# Patient Record
Sex: Male | Born: 1965 | Race: White | Hispanic: No | Marital: Married | Smoking: Never smoker
Health system: Southern US, Community
[De-identification: ages and names within clinical notes are randomized; demographics above are authoritative.]

## PROBLEM LIST (undated history)

## (undated) DIAGNOSIS — I5022 Chronic systolic (congestive) heart failure: Secondary | ICD-10-CM

## (undated) DIAGNOSIS — E785 Hyperlipidemia, unspecified: Secondary | ICD-10-CM

## (undated) DIAGNOSIS — M109 Gout, unspecified: Secondary | ICD-10-CM

## (undated) DIAGNOSIS — I429 Cardiomyopathy, unspecified: Secondary | ICD-10-CM

## (undated) DIAGNOSIS — I480 Paroxysmal atrial fibrillation: Secondary | ICD-10-CM

## (undated) DIAGNOSIS — I10 Essential (primary) hypertension: Secondary | ICD-10-CM

## (undated) DIAGNOSIS — E119 Type 2 diabetes mellitus without complications: Secondary | ICD-10-CM

## (undated) HISTORY — DX: Gout, unspecified: M10.9

## (undated) HISTORY — DX: Paroxysmal atrial fibrillation: I48.0

## (undated) HISTORY — DX: Hyperlipidemia, unspecified: E78.5

## (undated) HISTORY — DX: Essential (primary) hypertension: I10

## (undated) HISTORY — DX: Chronic systolic (congestive) heart failure: I50.22

## (undated) HISTORY — DX: Type 2 diabetes mellitus without complications: E11.9

## (undated) HISTORY — DX: Cardiomyopathy, unspecified: I42.9

---

## 2006-03-13 ENCOUNTER — Emergency Department: Payer: Self-pay | Admitting: Emergency Medicine

## 2006-10-24 ENCOUNTER — Ambulatory Visit: Payer: Self-pay | Admitting: Gastroenterology

## 2007-11-19 ENCOUNTER — Ambulatory Visit: Payer: Self-pay

## 2011-12-30 ENCOUNTER — Inpatient Hospital Stay: Payer: Self-pay | Admitting: Internal Medicine

## 2011-12-30 LAB — BASIC METABOLIC PANEL
Anion Gap: 9 (ref 7–16)
BUN: 12 mg/dL (ref 7–18)
Calcium, Total: 8.6 mg/dL (ref 8.5–10.1)
Creatinine: 0.73 mg/dL (ref 0.60–1.30)
EGFR (African American): >60
EGFR (Non-African Amer.): >60
Glucose: 254 mg/dL — ABNORMAL HIGH (ref 65–99)
Osmolality: 290 (ref 275–301)
Potassium: 3.7 mmol/L (ref 3.5–5.1)
Sodium: 141 mmol/L (ref 136–145)

## 2011-12-30 LAB — CBC
HCT: 44.1 % (ref 40.0–52.0)
MCHC: 33 g/dL (ref 32.0–36.0)
MCV: 89 fL (ref 80–100)
Platelet: 180 10*3/uL (ref 150–440)
RDW: 13.8 % (ref 11.5–14.5)

## 2011-12-30 LAB — TROPONIN I: Troponin-I: 0.03 ng/mL

## 2011-12-30 LAB — CK TOTAL AND CKMB (NOT AT ARMC): CK, Total: 49 U/L (ref 35–232)

## 2011-12-30 LAB — PRO B NATRIURETIC PEPTIDE: B-Type Natriuretic Peptide: 847 pg/mL — ABNORMAL HIGH (ref 0–125)

## 2011-12-31 DIAGNOSIS — I5021 Acute systolic (congestive) heart failure: Secondary | ICD-10-CM

## 2011-12-31 DIAGNOSIS — I369 Nonrheumatic tricuspid valve disorder, unspecified: Secondary | ICD-10-CM

## 2011-12-31 LAB — COMPREHENSIVE METABOLIC PANEL
Anion Gap: 8 (ref 7–16)
BUN: 11 mg/dL (ref 7–18)
Bilirubin,Total: 0.7 mg/dL (ref 0.2–1.0)
Calcium, Total: 8.6 mg/dL (ref 8.5–10.1)
Chloride: 105 mmol/L (ref 98–107)
Co2: 27 mmol/L (ref 21–32)
Creatinine: 0.72 mg/dL (ref 0.60–1.30)
EGFR (African American): >60
EGFR (Non-African Amer.): >60
Glucose: 203 mg/dL — ABNORMAL HIGH (ref 65–99)
SGOT(AST): 28 U/L (ref 15–37)
SGPT (ALT): 97 U/L — ABNORMAL HIGH (ref 12–78)
Sodium: 140 mmol/L (ref 136–145)

## 2011-12-31 LAB — CBC WITH DIFFERENTIAL/PLATELET
Basophil #: 0.1 10*3/uL (ref 0.0–0.1)
Eosinophil #: 0.1 10*3/uL (ref 0.0–0.7)
HCT: 44.1 % (ref 40.0–52.0)
HGB: 15 g/dL (ref 13.0–18.0)
Lymphocyte %: 28.9 %
MCH: 30.3 pg (ref 26.0–34.0)
MCHC: 34.1 g/dL (ref 32.0–36.0)
MCV: 89 fL (ref 80–100)
Monocyte #: 0.7 x10 3/mm (ref 0.2–1.0)
Neutrophil #: 3.7 10*3/uL (ref 1.4–6.5)
Neutrophil %: 57.7 %
RDW: 13.8 % (ref 11.5–14.5)

## 2011-12-31 LAB — TSH: Thyroid Stimulating Horm: 1.15 u[IU]/mL

## 2011-12-31 LAB — HEMOGLOBIN A1C: Hemoglobin A1C: 10 % — ABNORMAL HIGH (ref 4.2–6.3)

## 2011-12-31 LAB — MAGNESIUM: Magnesium: 1.7 mg/dL — ABNORMAL LOW

## 2011-12-31 LAB — LIPID PANEL: Triglycerides: 254 mg/dL — ABNORMAL HIGH (ref 0–200)

## 2012-01-01 ENCOUNTER — Encounter: Payer: Self-pay | Admitting: *Deleted

## 2012-01-01 ENCOUNTER — Telehealth: Payer: Self-pay | Admitting: *Deleted

## 2012-01-01 MED ORDER — ASPIRIN 81 MG PO TABS: 81.0000 mg | ORAL_TABLET | Freq: Every day | ORAL | Status: AC

## 2012-01-01 MED ORDER — CARVEDILOL 6.25 MG PO TABS: 6.2500 mg | ORAL_TABLET | Freq: Two times a day (BID) | ORAL | Status: DC

## 2012-01-01 MED ORDER — LISINOPRIL 20 MG PO TABS: 20.0000 mg | ORAL_TABLET | Freq: Every day | ORAL | Status: DC

## 2012-01-01 MED ORDER — ATORVASTATIN CALCIUM 40 MG PO TABS: 40.0000 mg | ORAL_TABLET | Freq: Every day | ORAL | Status: DC

## 2012-01-01 MED ORDER — FUROSEMIDE 20 MG PO TABS: 20.0000 mg | ORAL_TABLET | Freq: Two times a day (BID) | ORAL | Status: DC

## 2012-01-01 MED ORDER — METFORMIN HCL 500 MG PO TABS: 500.0000 mg | ORAL_TABLET | Freq: Two times a day (BID) | ORAL | Status: DC

## 2012-01-01 NOTE — Telephone Encounter (Signed)
Hospital Discharge transitional care: pt denies swelling but has had occasional SOB last night- no SOB currently. Discussed EF > 25% and importance of daily weights and low sodium diet/ high sodium foods reviewed to avoid , reviewed all med's and pt has all available to him. Confirmed app date time and location. Asked him to call with questions and concerns, pt agreed to plan.

## 2012-01-07 ENCOUNTER — Ambulatory Visit (INDEPENDENT_AMBULATORY_CARE_PROVIDER_SITE_OTHER): Payer: BC Managed Care – PPO | Admitting: Nurse Practitioner

## 2012-01-07 ENCOUNTER — Encounter: Payer: Self-pay | Admitting: Nurse Practitioner

## 2012-01-07 VITALS — BP 90/62 | HR 83 | Ht 72.0 in | Wt 257.5 lb

## 2012-01-07 DIAGNOSIS — I480 Paroxysmal atrial fibrillation: Secondary | ICD-10-CM

## 2012-01-07 DIAGNOSIS — I428 Other cardiomyopathies: Secondary | ICD-10-CM

## 2012-01-07 DIAGNOSIS — I5022 Chronic systolic (congestive) heart failure: Secondary | ICD-10-CM

## 2012-01-07 DIAGNOSIS — I10 Essential (primary) hypertension: Secondary | ICD-10-CM

## 2012-01-07 DIAGNOSIS — I4891 Unspecified atrial fibrillation: Secondary | ICD-10-CM

## 2012-01-07 DIAGNOSIS — I420 Dilated cardiomyopathy: Secondary | ICD-10-CM | POA: Insufficient documentation

## 2012-01-07 DIAGNOSIS — I509 Heart failure, unspecified: Secondary | ICD-10-CM

## 2012-01-07 DIAGNOSIS — R0602 Shortness of breath: Secondary | ICD-10-CM

## 2012-01-07 DIAGNOSIS — E119 Type 2 diabetes mellitus without complications: Secondary | ICD-10-CM

## 2012-01-07 MED ORDER — LISINOPRIL 10 MG PO TABS: 10.0000 mg | ORAL_TABLET | Freq: Every day | ORAL | Status: DC

## 2012-01-07 NOTE — Patient Instructions (Addendum)
Your physician wants you to follow-up 02/04/12 at 10:30 am with Dr. Mariah Milling. You will receive a reminder letter in the mail two months in advance. If you don't receive a letter, please call our office to schedule the follow-up appointment.  Your physician has recommended you make the following change in your medication:  -decrease lisinopril to 10 mg daily  [facility] MYOVIEW  Your caregiver has ordered a Stress Test with nuclear imaging. The purpose of this test is to evaluate the blood supply to your heart muscle. This procedure is referred to as a "Non-Invasive Stress Test." This is because other than having an IV started in your vein, nothing is inserted or "invades" your body. Cardiac stress tests are done to find areas of poor blood flow to the heart by determining the extent of coronary artery disease (CAD). Some patients exercise on a treadmill, which naturally increases the blood flow to your heart, while others who are  unable to walk on a treadmill due to physical limitations have a pharmacologic/chemical stress agent called Lexiscan . This medicine will mimic walking on a treadmill by temporarily increasing your coronary blood flow.   Please note: these test may take anywhere between 2-4 hours to complete  PLEASE REPORT TO [facility] MEDICAL MALL ENTRANCE  THE VOLUNTEERS AT THE FIRST DESK WILL DIRECT YOU WHERE TO GO  Date of Procedure:_____________________________________  Arrival Time for Procedure:______________________________  Instructions regarding medication:   ____ : Hold diabetes medication morning of procedure  ____:  Hold betablocker(s) night before procedure and morning of procedure  ____:  Hold other medications as  follows:_________________________________________________________________________________________________________________________________________________________________________________________________________________________________________________________________________________________  PLEASE NOTIFY THE OFFICE AT LEAST 24 HOURS IN ADVANCE IF YOU ARE UNABLE TO KEEP YOUR APPOINTMENT.  [phone]  How to prepare for your Myoview test:  1. Do not eat or drink after midnight 2. No caffeine for 24 hours prior to test 3. Your medication may be taken with water.  If your doctor stopped a medication because of this test, do not take that medication. 4. Ladies, please do not wear dresses.  Skirts or pants are appropriate. Please wear a short sleeve shirt. 5. No perfume, cologne or lotion. 6. Wear comfortable walking shoes. No heels!  Your physician has recommended that you wear an event monitor. Event monitors are medical devices that record the heart's electrical activity. Doctors most often [place] these monitors to diagnose arrhythmias. Arrhythmias are problems with the speed or rhythm of the heartbeat. The monitor is a small, portable device. You can wear one while you do your normal daily activities. This is usually used to diagnose what is causing palpitations/syncope (passing out).

## 2012-01-07 NOTE — Progress Notes (Signed)
Patient Name: Brad Stephens Date of Encounter: 01/07/2012  Primary Cardiologist:  Concha Se, MD  Patient Profile  46 year old male recently diagnosed with cardiomyopathy who presents for hospital follow-up.  Problem List   Past Medical History  Diagnosis Date  . Hypertension   . Gout   . Cardiomyopathy     a. 12/2011 Echo: EF <25%, glob HK, mildly dilated LA,.  . Paroxysmal atrial fibrillation     a. 12/2011 2 hr run while hospitalized @ [facility] for CHF;  b. CHADS2 = 3 (CHF, HTN, DM).  . Diabetes     a. 12/2011 A1c = 10.0.  Marland Kitchen Hyperlipidemia    History reviewed. No pertinent past surgical history.  Allergies  Allergies  Allergen Reactions  . Codeine   . Prednisone   . Shellfish Allergy     HPI  46 year old male with the above problem list.  He was in his usual state of health until sometime just prior to Thanksgiving this year when he began to experience nasal and chest congestion along with cough.  He used over-the-counter medications for treatment of presumed bronchitis.  Unfortunately, symptoms persisted and subsequently worsened to include dyspnea with minimal activity followed by orthopnea.  He was admitted to [facility] regional on December 22 secondary to worsening orthopnea.  There, his chest x-ray showed pulmonary edema.  He was admitted for further evaluation.  An echocardiogram was carried out and showed an EF of less than 25%.  He was seen by cardiology and placed on beta blocker, ACE inhibitor, and diuretic therapy.  With diuresis, he had symptomatic improvement.  He was noted to have a two-hour episode of paroxysmal atrial fibrillation.  He was placed on aspirin.  He was also found to have an elevated hemoglobin A1c of 10.0.  He was placed on metformin therapy and advised to followup with his primary care provider.  Since discharge from the hospital, he has overall been feeling well.  His weight has been coming down and he is 257 today which he says is 5 pounds less  than what he wasn't discharged.  He has had marked improvement in dyspnea although he did have a brief episode of orthopnea 2 nights ago.  He has occasionally noted some dizziness that occurs when he goes from a lying or sitting to standing position.  He does not check his blood pressure at home.  He denies chest pain, pnd, n, v, dizziness, syncope, edema, weight gain, or early satiety.  Home Medications  Prior to Admission medications   Medication Sig Start Date End Date Taking? Authorizing Provider  aspirin 81 MG tablet Take 1 tablet (81 mg total) by mouth daily. 01/01/12  Yes Antonieta Iba, MD  atorvastatin (LIPITOR) 40 MG tablet Take 1 tablet (40 mg total) by mouth daily. 01/01/12  Yes Antonieta Iba, MD  carvedilol (COREG) 6.25 MG tablet Take 1 tablet (6.25 mg total) by mouth 2 (two) times daily. 01/01/12  Yes Antonieta Iba, MD  furosemide (LASIX) 20 MG tablet Take 1 tablet (20 mg total) by mouth 2 (two) times daily. 01/01/12  Yes Antonieta Iba, MD  lisinopril (PRINIVIL,ZESTRIL) 20 MG tablet Take 1 tablet (20 mg total) by mouth daily. **reduced to 1/2 tab daily today. 01/01/12  Yes Antonieta Iba, MD  metFORMIN (GLUCOPHAGE) 500 MG tablet Take 1 tablet (500 mg total) by mouth 2 (two) times daily with a meal. 01/01/12  Yes Antonieta Iba, MD   Review of Systems  Occasional orthostatic  dizziness as noted above.  1 episode of orthopnea 2 nights ago requiring that he sleep in his recliner, though weight has been coming down.  All other systems reviewed and are otherwise negative except as noted above.  Physical Exam  Blood pressure 90/62, pulse 83, height 6' (1.829 m), weight 257 lb 8 oz (116.801 kg).  General: Pleasant, NAD Psych: Normal affect. Neuro: Alert and oriented X 3. Moves all extremities spontaneously. HEENT: Normal  Neck: Supple without bruits or JVD. Lungs:  Resp regular and unlabored, CTA. Heart: RRR no s3, s4, or murmurs. Abdomen: Soft, non-tender,  non-distended, BS + x 4.  Extremities: No clubbing, cyanosis or edema. DP/PT/Radials 2+ and equal bilaterally.  Accessory Clinical Findings  ECG - rsr, 83, no acute st/t changes.  Assessment & Plan  1.  Cardiomyopathy/Acute systolic CHF:  ? Ischemic vs non-ischemic.  Ss started with viral type illness/bronchitis back in November.  Today, he is euvolemic.  He has mostly been feeling well w/ significant reduction in DOE.  He is weighing himself daily and trying to avoid salty foods.  He did have 1 episode of orthopnea 2 nights ago, but this has not recurred.  I have arranged for an exercise myoview to r/o ischemia as a contributor to his cardiomyopathy.  Given complaints of orthostasis/dizziness, with a BP of 90/62, I have reduced his lisinopril to 10mg  daily.  I will check a BMET today to f/u renal fxn/potassium (he is not on replacement).  F/U echo in 12 wks.  2.  Orthostasis/Relative Hypotn:  Reduce lisinopril to 10mg  daily as above.  3.  PAF:  Brief spell of afib while hospitalized.  He says that he felt it while in it.  He has had no recurrence of palpitations since d/c but says that he thinks he's had afib before.  I am placing an event monitor to assess his burden of afib.  Given his CHADS2 score of 3 (CHF, HTN, DM), would plan to place him on oral anticoagulation if he has more afib.  Cont bb and asa for the time being.  4.  HTN:  Stable to low.  Reducing lisinopril as above.  5.  HL:  He is on lipitor.  TC 149, LDL 65.  TG were elevated @ 254.  ALT was mildly elevated in hospital in setting of acute chf.  He will need f/u in 8 wks.  6.  DM:  A1c = 10 in hospital.  On metformin.  We discussed role of weight loss in mgmt.  He will f/u with his pcp.  7.  Dispo:  Bmet today.  Myoview and event monitor to be scheduled.  Follow up with Dr. Mariah Milling in approximately 4 wks.  Nicolasa Ducking, NP 01/07/2012, 12:28 PM

## 2012-01-08 DIAGNOSIS — R0602 Shortness of breath: Secondary | ICD-10-CM

## 2012-01-08 LAB — BASIC METABOLIC PANEL
BUN: 13 mg/dL (ref 6–24)
CO2: 23 mmol/L (ref 19–28)
Calcium: 9.3 mg/dL (ref 8.7–10.2)
Chloride: 101 mmol/L (ref 97–108)
Creatinine, Ser: 0.93 mg/dL (ref 0.76–1.27)
Glucose: 164 mg/dL — ABNORMAL HIGH (ref 65–99)

## 2012-01-08 NOTE — Progress Notes (Unsigned)
ecardio monitor ordered 

## 2012-01-11 DIAGNOSIS — I4891 Unspecified atrial fibrillation: Secondary | ICD-10-CM

## 2012-01-16 ENCOUNTER — Ambulatory Visit: Payer: Self-pay | Admitting: Cardiovascular Disease

## 2012-01-16 DIAGNOSIS — R0602 Shortness of breath: Secondary | ICD-10-CM

## 2012-01-17 ENCOUNTER — Telehealth: Payer: Self-pay

## 2012-01-17 NOTE — Telephone Encounter (Signed)
"  call pt to tell him stress test was normal: no ischemia but EF is still low -30%. Tell him to stop ETOH and keep taking meds as prescribed. Keep follow up with me as scheduled" VO Dr. Alvis Lemmings, RN

## 2012-01-17 NOTE — Telephone Encounter (Signed)
Pt informed Understanding verb 

## 2012-01-24 ENCOUNTER — Other Ambulatory Visit: Payer: Self-pay | Admitting: *Deleted

## 2012-01-24 DIAGNOSIS — R0602 Shortness of breath: Secondary | ICD-10-CM

## 2012-01-28 ENCOUNTER — Ambulatory Visit: Payer: BC Managed Care – PPO | Admitting: Cardiovascular Disease

## 2012-01-31 ENCOUNTER — Ambulatory Visit (INDEPENDENT_AMBULATORY_CARE_PROVIDER_SITE_OTHER): Payer: BC Managed Care – PPO | Admitting: Cardiovascular Disease

## 2012-01-31 ENCOUNTER — Encounter: Payer: Self-pay | Admitting: Cardiovascular Disease

## 2012-01-31 VITALS — BP 138/74 | HR 64 | Ht 72.0 in | Wt 252.5 lb

## 2012-01-31 DIAGNOSIS — I4891 Unspecified atrial fibrillation: Secondary | ICD-10-CM

## 2012-01-31 DIAGNOSIS — I10 Essential (primary) hypertension: Secondary | ICD-10-CM

## 2012-01-31 DIAGNOSIS — I509 Heart failure, unspecified: Secondary | ICD-10-CM

## 2012-01-31 DIAGNOSIS — I5022 Chronic systolic (congestive) heart failure: Secondary | ICD-10-CM

## 2012-01-31 DIAGNOSIS — E119 Type 2 diabetes mellitus without complications: Secondary | ICD-10-CM

## 2012-01-31 DIAGNOSIS — I428 Other cardiomyopathies: Secondary | ICD-10-CM

## 2012-01-31 DIAGNOSIS — I480 Paroxysmal atrial fibrillation: Secondary | ICD-10-CM

## 2012-01-31 DIAGNOSIS — I420 Dilated cardiomyopathy: Secondary | ICD-10-CM

## 2012-01-31 MED ORDER — FUROSEMIDE 20 MG PO TABS: 20.0000 mg | ORAL_TABLET | Freq: Two times a day (BID) | ORAL | Status: DC

## 2012-01-31 MED ORDER — CARVEDILOL 6.25 MG PO TABS: 6.2500 mg | ORAL_TABLET | Freq: Two times a day (BID) | ORAL | Status: DC

## 2012-01-31 MED ORDER — LISINOPRIL 10 MG PO TABS: 10.0000 mg | ORAL_TABLET | Freq: Every day | ORAL | Status: DC

## 2012-01-31 NOTE — Progress Notes (Signed)
Patient ID: Brad Stephens, male    DOB: 09/06/1965, 47 y.o.   MRN: [id number]  HPI Comments: 47 year old male with nonischemic cardiomyopathy presenting in December 2013 with shortness of breath, orthopnea, PND . Outpatient stress test showed no ischemia . 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 He travels frequently for business trips. He had short run of atrial fibrillation in the hospital.  He was started on Coreg, lisinopril, diuretics. Secondary to very low systolic pressure, lisinopril dose was decreased from 20 mg to 10 mg . Lasix dose was decreased. He takes this now occasionally . No edema, abdominal swelling or shortness of breath with exertion . He's been trying to eat health, less fluid intake, alcohol abstinence .    echocardiogram was carried out and showed an EF of less than 25%.    hemoglobin A1c of 10.0.    Overall he feels well today with no complaints. It was suggested he wear a 30 day monitor. Evaluation of this shows no significant ischemia. No atrial fibrillation   Outpatient Encounter Prescriptions as of 01/31/2012  Medication Sig Dispense Refill  . aspirin 81 MG tablet Take 1 tablet (81 mg total) by mouth daily.  30 tablet    . atorvastatin (LIPITOR) 40 MG tablet Take 1 tablet (40 mg total) by mouth daily.  90 tablet  3  . carvedilol (COREG) 6.25 MG tablet Take 1 tablet (6.25 mg total) by mouth 2 (two) times daily.  180 tablet  3  . lisinopril (PRINIVIL,ZESTRIL) 10 MG tablet Take 1 tablet (10 mg total) by mouth daily.  90 tablet  3  . metFORMIN (GLUCOPHAGE) 500 MG tablet Take 1 tablet (500 mg total) by mouth 2 (two) times daily with a meal.        Review of Systems  Constitutional: Negative.   HENT: Negative.   Eyes: Negative.   Respiratory: Negative.   Cardiovascular: Negative.   Gastrointestinal: Negative.   Musculoskeletal: Negative.   Skin: Negative.   Neurological: Negative.   Hematological: Negative.   Psychiatric/Behavioral: Negative.   All  other systems reviewed and are negative.    BP 138/74  Pulse 64  Ht 6' (1.829 m)  Wt 252 lb 8 oz (114.533 kg)  BMI 34.25 kg/m2  Physical Exam  Nursing note and vitals reviewed. Constitutional: He is oriented to person, place, and time. He appears well-developed and well-nourished.  HENT:  Head: Normocephalic.  Nose: Nose normal.  Mouth/Throat: Oropharynx is clear and moist.  Eyes: Conjunctivae normal are normal. Pupils are equal, round, and reactive to light.  Neck: Normal range of motion. Neck supple. No JVD present.  Cardiovascular: Normal rate, regular rhythm, S1 normal, S2 normal, normal heart sounds and intact distal pulses.  Exam reveals no gallop and no friction rub.   No murmur heard. Pulmonary/Chest: Effort normal and breath sounds normal. No respiratory distress. He has no wheezes. He has no rales. He exhibits no tenderness.  Abdominal: Soft. Bowel sounds are normal. He exhibits no distension. There is no tenderness.  Musculoskeletal: Normal range of motion. He exhibits no edema and no tenderness.  Lymphadenopathy:    He has no cervical adenopathy.  Neurological: He is alert and oriented to person, place, and time. Coordination normal.  Skin: Skin is warm and dry. No rash noted. No erythema.  Psychiatric: He has a normal mood and affect. His behavior is normal. Judgment and thought content normal.           Assessment and Plan

## 2012-01-31 NOTE — Assessment & Plan Note (Signed)
We have recommended alcohol abstinence, low fluid intake, continue to take his medications, Lasix as needed for edema or any weight gain.

## 2012-01-31 NOTE — Assessment & Plan Note (Signed)
Blood pressure is well controlled on today's visit. No changes made to the medications. 

## 2012-01-31 NOTE — Assessment & Plan Note (Signed)
No arrhythmia seen on 30 day monitor. No symptoms of palpitations or tachycardia.

## 2012-01-31 NOTE — Assessment & Plan Note (Signed)
He'll need followup with his primary care physician for his diabetes. He does not have the monitoring equipment for home checking.

## 2012-01-31 NOTE — Patient Instructions (Addendum)
You are doing well. No medication changes were made.  Please take lasix as needed for edema, shortness of breath or weight gain  Please call us if you have new issues that need to be addressed before your next appt.  Your physician wants you to follow-up in: 6 months.  You will receive a reminder letter in the mail two months in advance. If you don't receive a letter, please call our office to schedule the follow-up appointment.

## 2012-01-31 NOTE — Assessment & Plan Note (Signed)
Appears euvolemic on clinical exam. We have suggested he watch his weight closely, use Lasix for any weight gain. Continue on lisinopril and carvedilol. He does not want to increase the lisinopril as this caused low blood pressure in the past.

## 2012-02-04 ENCOUNTER — Ambulatory Visit: Payer: BC Managed Care – PPO | Admitting: Cardiovascular Disease

## 2012-02-23 ENCOUNTER — Other Ambulatory Visit: Payer: Self-pay

## 2012-09-03 ENCOUNTER — Telehealth: Payer: Self-pay | Admitting: *Deleted

## 2012-09-03 NOTE — Telephone Encounter (Signed)
Lmom to call to sched and appt w/ Dr. Mariah Milling.

## 2012-11-13 ENCOUNTER — Other Ambulatory Visit: Payer: Self-pay

## 2013-03-23 ENCOUNTER — Other Ambulatory Visit: Payer: Self-pay | Admitting: Cardiovascular Disease

## 2013-12-21 LAB — CBC
HCT: 45.6 % (ref 40.0–52.0)
HGB: 15.3 g/dL (ref 13.0–18.0)
MCH: 30.5 pg (ref 26.0–34.0)
MCHC: 33.5 g/dL (ref 32.0–36.0)
MCV: 91 fL (ref 80–100)
Platelet: 181 10*3/uL (ref 150–440)
RBC: 5.01 10*6/uL (ref 4.40–5.90)
RDW: 13.4 % (ref 11.5–14.5)
WBC: 9.7 10*3/uL (ref 3.8–10.6)

## 2013-12-21 LAB — BASIC METABOLIC PANEL
Anion Gap: 10 (ref 7–16)
BUN: 13 mg/dL (ref 7–18)
CALCIUM: 8.7 mg/dL (ref 8.5–10.1)
CHLORIDE: 100 mmol/L (ref 98–107)
Co2: 27 mmol/L (ref 21–32)
Creatinine: 0.84 mg/dL (ref 0.60–1.30)
EGFR (African American): >60
EGFR (Non-African Amer.): >60
Glucose: 359 mg/dL — ABNORMAL HIGH (ref 65–99)
Osmolality: 288 (ref 275–301)
POTASSIUM: 3.8 mmol/L (ref 3.5–5.1)
SODIUM: 137 mmol/L (ref 136–145)

## 2013-12-21 LAB — TROPONIN I: TROPONIN-I: 0.02 ng/mL

## 2013-12-21 LAB — PROTIME-INR
INR: 1
PROTHROMBIN TIME: 13.4 s (ref 11.5–14.7)

## 2013-12-21 LAB — PRO B NATRIURETIC PEPTIDE: B-Type Natriuretic Peptide: 1391 pg/mL — ABNORMAL HIGH (ref 0–125)

## 2013-12-22 ENCOUNTER — Inpatient Hospital Stay: Payer: Self-pay | Admitting: Internal Medicine

## 2013-12-22 LAB — CK-MB: CK-MB: 0.6 ng/mL (ref 0.5–3.6)

## 2013-12-22 LAB — HEMOGLOBIN A1C: Hemoglobin A1C: 10.7 % — ABNORMAL HIGH (ref 4.2–6.3)

## 2013-12-22 LAB — TROPONIN I
TROPONIN-I: 0.02 ng/mL
Troponin-I: 0.02 ng/mL
Troponin-I: <0.02 ng/mL

## 2013-12-22 LAB — CK TOTAL AND CKMB (NOT AT ARMC)
CK, Total: 57 U/L (ref 39–308)
CK-MB: <0.5 ng/mL — ABNORMAL LOW (ref 0.5–3.6)

## 2013-12-23 LAB — CBC WITH DIFFERENTIAL/PLATELET
BASOS PCT: 0.5 %
Basophil #: 0 10*3/uL (ref 0.0–0.1)
Eosinophil #: 0 10*3/uL (ref 0.0–0.7)
Eosinophil %: 0.3 %
HCT: 41.4 % (ref 40.0–52.0)
HGB: 13.5 g/dL (ref 13.0–18.0)
LYMPHS ABS: 1.7 10*3/uL (ref 1.0–3.6)
Lymphocyte %: 19.1 %
MCH: 30.3 pg (ref 26.0–34.0)
MCHC: 32.6 g/dL (ref 32.0–36.0)
MCV: 93 fL (ref 80–100)
MONO ABS: 1 x10 3/mm (ref 0.2–1.0)
Monocyte %: 11 %
NEUTROS PCT: 69.1 %
Neutrophil #: 6.3 10*3/uL (ref 1.4–6.5)
Platelet: 156 10*3/uL (ref 150–440)
RBC: 4.45 10*6/uL (ref 4.40–5.90)
RDW: 13.6 % (ref 11.5–14.5)
WBC: 9.1 10*3/uL (ref 3.8–10.6)

## 2013-12-23 LAB — LIPID PANEL
Cholesterol: 111 mg/dL (ref 0–200)
Cholesterol: 111 mg/dL (ref 0–200)
HDL Cholesterol: 55 mg/dL (ref 40–60)
HDL: 55 mg/dL (ref 35–70)
LDL CALC: 31 mg/dL
Ldl Cholesterol, Calc: 31 mg/dL (ref 0–100)
Triglycerides: 124 mg/dL (ref 0–200)
Triglycerides: 124 mg/dL (ref 40–160)
VLDL Cholesterol, Calc: 25 mg/dL (ref 5–40)

## 2013-12-23 LAB — BASIC METABOLIC PANEL
Anion Gap: 9 (ref 7–16)
BUN: 15 mg/dL (ref 4–21)
BUN: 15 mg/dL (ref 7–18)
CALCIUM: 8.4 mg/dL — AB (ref 8.5–10.1)
Chloride: 99 mmol/L (ref 98–107)
Co2: 27 mmol/L (ref 21–32)
Creatinine: 0.8 mg/dL (ref 0.6–1.3)
Creatinine: 0.84 mg/dL (ref 0.60–1.30)
EGFR (African American): >60
EGFR (Non-African Amer.): >60
Glucose: 215 mg/dL
Glucose: 215 mg/dL — ABNORMAL HIGH (ref 65–99)
Osmolality: 277 (ref 275–301)
POTASSIUM: 4 mmol/L (ref 3.5–5.1)
Potassium: 4 mmol/L (ref 3.4–5.3)
Sodium: 135 mmol/L — AB (ref 137–147)
Sodium: 135 mmol/L — ABNORMAL LOW (ref 136–145)

## 2013-12-23 LAB — TSH
THYROID STIMULATING HORM: 0.84 u[IU]/mL
TSH: 0.84 u[IU]/mL (ref 0.41–5.90)

## 2013-12-23 LAB — HEMOGLOBIN A1C: Hgb A1c MFr Bld: 10.7 % — AB (ref 4.0–6.0)

## 2013-12-23 LAB — MAGNESIUM: MAGNESIUM: 1.7 mg/dL — AB

## 2013-12-24 LAB — BASIC METABOLIC PANEL
ANION GAP: 2 — AB (ref 7–16)
BUN: 20 mg/dL — ABNORMAL HIGH (ref 7–18)
CHLORIDE: 104 mmol/L (ref 98–107)
Calcium, Total: 8.6 mg/dL (ref 8.5–10.1)
Co2: 25 mmol/L (ref 21–32)
Creatinine: 0.8 mg/dL (ref 0.60–1.30)
EGFR (Non-African Amer.): >60
Glucose: 240 mg/dL — ABNORMAL HIGH (ref 65–99)
Osmolality: 273 (ref 275–301)
Potassium: 4.2 mmol/L (ref 3.5–5.1)
Sodium: 131 mmol/L — ABNORMAL LOW (ref 136–145)

## 2013-12-24 LAB — MAGNESIUM: MAGNESIUM: 2 mg/dL

## 2013-12-28 ENCOUNTER — Ambulatory Visit: Payer: BC Managed Care – PPO | Admitting: Cardiovascular Disease

## 2014-01-02 ENCOUNTER — Other Ambulatory Visit: Payer: Self-pay | Admitting: Cardiovascular Disease

## 2014-01-26 ENCOUNTER — Ambulatory Visit: Payer: Self-pay | Admitting: Cardiovascular Disease

## 2014-01-28 ENCOUNTER — Other Ambulatory Visit: Payer: Self-pay | Admitting: Cardiovascular Disease

## 2014-02-05 ENCOUNTER — Encounter: Payer: Self-pay | Admitting: Cardiovascular Disease

## 2014-02-05 ENCOUNTER — Ambulatory Visit (INDEPENDENT_AMBULATORY_CARE_PROVIDER_SITE_OTHER): Payer: BLUE CROSS/BLUE SHIELD | Admitting: Cardiovascular Disease

## 2014-02-05 VITALS — BP 110/68 | HR 99 | Ht 72.0 in | Wt 252.5 lb

## 2014-02-05 DIAGNOSIS — R002 Palpitations: Secondary | ICD-10-CM

## 2014-02-05 DIAGNOSIS — E119 Type 2 diabetes mellitus without complications: Secondary | ICD-10-CM

## 2014-02-05 DIAGNOSIS — I420 Dilated cardiomyopathy: Secondary | ICD-10-CM

## 2014-02-05 DIAGNOSIS — I480 Paroxysmal atrial fibrillation: Secondary | ICD-10-CM

## 2014-02-05 DIAGNOSIS — I5022 Chronic systolic (congestive) heart failure: Secondary | ICD-10-CM

## 2014-02-05 DIAGNOSIS — E1169 Type 2 diabetes mellitus with other specified complication: Secondary | ICD-10-CM

## 2014-02-05 DIAGNOSIS — F1010 Alcohol abuse, uncomplicated: Secondary | ICD-10-CM | POA: Insufficient documentation

## 2014-02-05 MED ORDER — LISINOPRIL 5 MG PO TABS: 5.0000 mg | ORAL_TABLET | Freq: Every day | ORAL | Status: DC

## 2014-02-05 MED ORDER — DILTIAZEM HCL 30 MG PO TABS: 30.0000 mg | ORAL_TABLET | Freq: Four times a day (QID) | ORAL | Status: DC | PRN

## 2014-02-05 MED ORDER — FUROSEMIDE 20 MG PO TABS: 20.0000 mg | ORAL_TABLET | Freq: Two times a day (BID) | ORAL | Status: DC | PRN

## 2014-02-05 MED ORDER — CARVEDILOL 25 MG PO TABS: 25.0000 mg | ORAL_TABLET | Freq: Two times a day (BID) | ORAL | Status: DC

## 2014-02-05 MED ORDER — METFORMIN HCL 1000 MG PO TABS: 1000.0000 mg | ORAL_TABLET | Freq: Two times a day (BID) | ORAL | Status: DC

## 2014-02-05 MED ORDER — ATORVASTATIN CALCIUM 40 MG PO TABS: 40.0000 mg | ORAL_TABLET | Freq: Every day | ORAL | Status: DC

## 2014-02-05 NOTE — Progress Notes (Signed)
Patient ID: Brad Stephens, male    DOB: 11-17-1965, 49 y.o.   MRN: [id number]  HPI Comments: 49 year old male with nonischemic cardiomyopathy possiblysecondary to alcohol abuse, who continues to drink up to one half Casebeer at a time,  presenting in December 2013 with shortness of breath, orthopnea, PND . Outpatient stress test showed no ischemia . Marland Kitchen He travels frequently for business trips. He had short run of atrial fibrillation in the hospital. He presents today after recent hospital admission 12/22/2013 He was last seen in 2014  He presented to the hospital with midsternal chest pain relief with narcotics, glucose was 350, in the emergency room was found to be in atrial flutter, converting to normal sinus rhythm with medical management. Echocardiogram confirmed ejection fraction 20-25% which was previously seen in 2013, hemoglobin A1c 10.6 It was felt he had pleuritic chest pain, possibly exacerbated by arrhythmia On today's visit he reports that he is doing okay. He is taking his discharge medications including Lipitor, carvedilol, Lasix, lisinopril, metformin 1000 mg twice a day  Lab work in the hospital showing total cholesterol 111, LDL 31, TSH 0.84, negative cardiac enzymes CT angiogram of the chest showing no PE, mild bilateral pleural effusions EKG on today's visit showing normal sinus rhythm with rate 99 bpm, T-wave abnormality-  He has started to change his diet, drinking shakes.  Allergies  Allergen Reactions  . Codeine   . Prednisone   . Shellfish Allergy     Outpatient Encounter Prescriptions as of 02/05/2014  Medication Sig  . aspirin 81 MG tablet Take 1 tablet (81 mg total) by mouth daily.  Marland Kitchen atorvastatin (LIPITOR) 40 MG tablet Take 1 tablet (40 mg total) by mouth daily.  . carvedilol (COREG) 25 MG tablet Take 1 tablet (25 mg total) by mouth 2 (two) times daily.  . furosemide (LASIX) 20 MG tablet Take 1 tablet (20 mg total) by mouth 2 (two) times daily as needed.  Marland Kitchen  lisinopril (PRINIVIL,ZESTRIL) 5 MG tablet Take 1 tablet (5 mg total) by mouth daily.  . metFORMIN (GLUCOPHAGE) 1000 MG tablet Take 1 tablet (1,000 mg total) by mouth 2 (two) times daily with a meal.  . [DISCONTINUED] atorvastatin (LIPITOR) 40 MG tablet Take 1 tablet (40 mg total) by mouth daily.  . [DISCONTINUED] carvedilol (COREG) 12.5 MG tablet Take 12.5 mg by mouth 2 (two) times daily.  . [DISCONTINUED] furosemide (LASIX) 20 MG tablet Take 20 mg by mouth daily.  . [DISCONTINUED] lisinopril (PRINIVIL,ZESTRIL) 5 MG tablet Take 5 mg by mouth daily.  . [DISCONTINUED] metFORMIN (GLUCOPHAGE) 1000 MG tablet Take 1,000 mg by mouth 2 (two) times daily with a meal.  . diltiazem (CARDIZEM) 30 MG tablet Take 1 tablet (30 mg total) by mouth 4 (four) times daily as needed.  . [DISCONTINUED] carvedilol (COREG) 6.25 MG tablet Take 1 tablet (6.25 mg total) by mouth 2 (two) times daily. (Patient not taking: Reported on 02/05/2014)  . [DISCONTINUED] furosemide (LASIX) 20 MG tablet Take 1 tablet (20 mg total) by mouth 2 (two) times daily. (Patient not taking: Reported on 02/05/2014)  . [DISCONTINUED] lisinopril (PRINIVIL,ZESTRIL) 10 MG tablet TAKE 1 TABLET (10 MG TOTAL) BY MOUTH DAILY. (Patient not taking: Reported on 02/05/2014)  . [DISCONTINUED] metFORMIN (GLUCOPHAGE) 500 MG tablet Take 1 tablet (500 mg total) by mouth 2 (two) times daily with a meal. (Patient not taking: Reported on 02/05/2014)    Past Medical History  Diagnosis Date  . Hypertension   . Gout   . Cardiomyopathy  a. 12/2011 Echo: EF <25%, glob HK, mildly dilated LA,.  . Paroxysmal atrial fibrillation     a. 12/2011 2 hr run [facility]F;  b. CHADS2 = 3 (CHF, HTN, DM).  . Diabetes     a. 12/2011 A1c = 10.0.  Marland Kitchen. Hyperlipidemia   . Chronic systolic CHF (congestive heart failure)     History reviewed. No pertinent past surgical history.  Social History  reports that he has never smoked. His smokeless tobacco use  includes Chew. He reports that he drinks alcohol. He reports that he does not use illicit drugs.  Family History Family history is unknown by patient.  Review of Systems  Constitutional: Negative.   HENT: Negative.   Respiratory: Negative.   Cardiovascular: Positive for palpitations.  Gastrointestinal: Negative.   Musculoskeletal: Negative.   Skin: Negative.   Neurological: Negative.   Hematological: Negative.   Psychiatric/Behavioral: Negative.   All other systems reviewed and are negative.   BP 110/68 mmHg  Pulse 99  Ht 6' (1.829 m)  Wt 252 lb 8 oz (114.533 kg)  BMI 34.24 kg/m2   Physical Exam  Constitutional: He is oriented to person, place, and time. He appears well-developed and well-nourished.  HENT:  Head: Normocephalic.  Nose: Nose normal.  Mouth/Throat: Oropharynx is clear and moist.  Eyes: Conjunctivae are normal. Pupils are equal, round, and reactive to light.  Neck: Normal range of motion. Neck supple. No JVD present.  Cardiovascular: Regular rhythm, S1 normal, S2 normal, normal heart sounds and intact distal pulses.  Tachycardia present.  Exam reveals no gallop and no friction rub.   No murmur heard. Pulmonary/Chest: Effort normal and breath sounds normal. No respiratory distress. He has no wheezes. He has no rales. He exhibits no tenderness.  Abdominal: Soft. Bowel sounds are normal. He exhibits no distension. There is no tenderness.  Musculoskeletal: Normal range of motion. He exhibits no edema or tenderness.  Lymphadenopathy:    He has no cervical adenopathy.  Neurological: He is alert and oriented to person, place, and time. Coordination normal.  Skin: Skin is warm and dry. No rash noted. No erythema.  Psychiatric: He has a normal mood and affect. His behavior is normal. Judgment and thought content normal.      Assessment and Plan   Nursing note and vitals reviewed.

## 2014-02-05 NOTE — Assessment & Plan Note (Signed)
We have made a referral to [facility] endocrine. Recommended that he stay on his metformin 1000 g twice a day, closely modify his diet

## 2014-02-05 NOTE — Patient Instructions (Addendum)
You are doing well. Please increase the coreg up to 25 mg twice a day  Please take diltiazem as needed for tachycardia, rate faster than 110 bpm at rest Ok to repeat if needed  Please call us if you have new issues that need to be addressed before your next appt.  Your physician wants you to follow-up in: 3 months.  You will receive a reminder letter in the mail two months in advance. If you don't receive a letter, please call our office to schedule the follow-up appointment.

## 2014-02-05 NOTE — Assessment & Plan Note (Signed)
Prior history of atrial fibrillation, now with atrial flutter. 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 We'll increase the carvedilol up to 25 mill grams twice a day

## 2014-02-05 NOTE — Assessment & Plan Note (Signed)
Recommended that he stay on Lasix daily. Extra Lasix provided for leg edema, abdominal bloating

## 2014-02-05 NOTE — Assessment & Plan Note (Signed)
We have discussed his medications with him. We will increase carvedilol for better rhythm, rate control. Now 25 mg twice a day

## 2014-02-05 NOTE — Assessment & Plan Note (Signed)
Alcohol problem since I've known him from 2014. Likely the cause of his cardiopathy. This was again discussed with him. Alcohol cessation recommended

## 2014-02-17 ENCOUNTER — Ambulatory Visit (INDEPENDENT_AMBULATORY_CARE_PROVIDER_SITE_OTHER): Payer: BLUE CROSS/BLUE SHIELD | Admitting: Endocrinology

## 2014-02-17 ENCOUNTER — Encounter: Payer: Self-pay | Admitting: Endocrinology

## 2014-02-17 VITALS — BP 128/66 | HR 86 | Resp 14 | Ht 72.0 in | Wt 264.2 lb

## 2014-02-17 DIAGNOSIS — I10 Essential (primary) hypertension: Secondary | ICD-10-CM

## 2014-02-17 DIAGNOSIS — E1169 Type 2 diabetes mellitus with other specified complication: Secondary | ICD-10-CM

## 2014-02-17 MED ORDER — ALBIGLUTIDE 30 MG ~~LOC~~ PEN: 30.0000 mg | PEN_INJECTOR | SUBCUTANEOUS | Status: DC

## 2014-02-17 MED ORDER — ONETOUCH DELICA LANCETS 33G MISC: Status: AC

## 2014-02-17 MED ORDER — GLUCOSE BLOOD VI STRP: ORAL_STRIP | Status: AC

## 2014-02-17 NOTE — Assessment & Plan Note (Signed)
BP at target today. Update urine MA at next few visits when the sugars are better.

## 2014-02-17 NOTE — Progress Notes (Signed)
Pre visit review using our clinic review tool, if applicable. No additional management support is needed unless otherwise documented below in the visit note. 

## 2014-02-17 NOTE — Progress Notes (Signed)
Reason for visit-  Brad MaclachlanKevin Stephens is a 49 y.o.-year-old male, referred by his cardiologist, Dr Mariah MillingGollan,  for management of Type 2 diabetes, uncontrolled, without complications. Associated hx CHF, CMP, pAF.   HPI- Patient has been diagnosed with diabetes in Dec 2015 during recent hospitalization. Had prior preDM per patient. Reports polydipsia since diagnosis.  Hasn't seen DM education during recent hospitalization.  he has not been on insulin before.    Pt is currently on a regimen of: - Metformin 1000 mg po bid    Last hemoglobin A1c was: 10.7% at [facility] in Dec 2015 Lab Results  Component Value Date   HGBA1C 10.7* 12/23/2013     Pt checks his sugars 0 a day . Uses no glucometer. By recall/meter download/meter review they are:  PREMEAL Breakfast Lunch Dinner Bedtime Overall  Glucose range:     n/a  Mean/median:        POST-MEAL PC Breakfast PC Lunch PC Dinner  Glucose range:     Mean/median:       Hypoglycemia-  No lows. Lowest sugar was n/a; he has hypoglycemia awareness at 70.   Dietary habits- eats three times daily. Tries to limit carbs, sweetened beverages, sodas, desserts. Juicing some, eats lots of vegetables. Loves fried foods but trying to avoid.  Exercise- walks for exercise . Travels a lot.  Weight - up since last time Wt Readings from Last 3 Encounters:  02/17/14 264 lb 4 oz (119.863 kg)  02/05/14 252 lb 8 oz (114.533 kg)  01/31/12 252 lb 8 oz (114.533 kg)    Diabetes Complications-  Nephropathy- No  CKD, last BUN/creatinine- GFR >60 Lab Results  Component Value Date   BUN 15 12/23/2013   CREATININE 0.8 12/23/2013    Retinopathy- No, Last DEE was in jan 2015- has follow up appt Neuropathy- no numbness and tingling in his feet. No known neuropathy.  Associated history - No CAD- has other cardiac history . No prior stroke. No hypothyroidism. his last TSH was  Lab Results  Component Value Date   TSH 0.84 12/23/2013    Hyperlipidemia-  his last set of  lipids were- Currently on Lipitor 40 mg ( recent start Dec 2015) Tolerating well.   Lab Results  Component Value Date   CHOL 111 12/23/2013   HDL 55 12/23/2013   LDLCALC 31 12/23/2013   TRIG 124 12/23/2013    Blood Pressure/HTN- Patient's blood pressure is well controlled today on current regimen that includes ACE-I.  Pt has FH of DM in none.  I have reviewed the patient's past medical history, family and social history, surgical history, medications and allergies.  Past Medical History  Diagnosis Date  . Hypertension   . Gout   . Cardiomyopathy     a. 12/2011 Echo: EF <25%, glob HK, mildly dilated LA,.  . Paroxysmal atrial fibrillation     a. 12/2011 2 hr run [facility];  b. CHADS2 = 3 (CHF, HTN, DM).  . Diabetes     a. 12/2011 A1c = 10.0.  Marland Kitchen. Hyperlipidemia   . Chronic systolic CHF (congestive heart failure)    No past surgical history on file. Family History  Problem Relation Age of Onset  . Family history unknown: Yes   History   Social History  . Marital Status: Married    Spouse Name: N/A  . Number of Children: N/A  . Years of Education: N/A   Occupational History  . Not on file.   Social  History Main Topics  . Smoking status: Never Smoker   . Smokeless tobacco: Current User    Types: Chew  . Alcohol Use: Yes     Comment: moderate  . Drug Use: No  . Sexual Activity: Not on file   Other Topics Concern  . Not on file   Social History Narrative   Current Outpatient Prescriptions on File Prior to Visit  Medication Sig Dispense Refill  . aspirin 81 MG tablet Take 1 tablet (81 mg total) by mouth daily. 30 tablet   . atorvastatin (LIPITOR) 40 MG tablet Take 1 tablet (40 mg total) by mouth daily. 90 tablet 3  . carvedilol (COREG) 25 MG tablet Take 1 tablet (25 mg total) by mouth 2 (two) times daily. 180 tablet 3  . diltiazem (CARDIZEM) 30 MG tablet Take 1 tablet (30 mg total) by mouth 4 (four) times daily as needed. 120 tablet 6  .  furosemide (LASIX) 20 MG tablet Take 1 tablet (20 mg total) by mouth 2 (two) times daily as needed. 180 tablet 3  . lisinopril (PRINIVIL,ZESTRIL) 5 MG tablet Take 1 tablet (5 mg total) by mouth daily. 90 tablet 3  . metFORMIN (GLUCOPHAGE) 1000 MG tablet Take 1 tablet (1,000 mg total) by mouth 2 (two) times daily with a meal. 180 tablet 3   No current facility-administered medications on file prior to visit.   Allergies  Allergen Reactions  . Codeine   . Prednisone   . Shellfish Allergy      Review of Systems:  complains of  [  ] denies General:   [  ] Recent weight change [  ] Fatigue  [  ] Loss of appetite Eyes: [  ]  Vision Difficulty [  ]  Eye pain ENT: [  ]  Hearing difficulty [  ]  Difficulty Swallowing CVS: [  ] Chest pain [  ]  Palpitations/Irregular Heart beat [  ]  Shortness of breath lying flat [  ] Swelling of legs Resp: [ x ] Frequent Cough [  ] Shortness of Breath  [  ]  Wheezing GI: [  ] Heartburn  [  ] Nausea or Vomiting  [  ] Diarrhea [  ] Constipation  [  ] Abdominal Pain GU: [  ]  Polyuria  [  ]  nocturia Bones/joints:  [  ]  Muscle aches  [  ] Joint Pain  [  ] Bone pain Skin/Hair/Nails: [  ]  Rash  [  ] New stretch marks [  ]  Itching [  ] Hair loss [  ]  Excessive hair growth Reproduction: [  ] Low sexual desire , [  ]  Women: Menstrual cycle problems [  ]  Women: Breast Discharge [  ] Men: Difficulty with erections [  ]  Men: Enlarged Breasts CNS: [  ] Frequent Headaches [  ] Blurry vision [  ] Tremors [  ] Seizures [  ] Loss of consciousness [  ] Localized weakness Endocrine: [x  ]  Excess thirst [  ]  Feeling excessively hot [  ]  Feeling excessively cold Heme: [  ]  Easy bruising [  ]  Enlarged glands or lumps in neck Allergy: [  ]  Food allergies [  ] Environmental allergies  PE: BP 128/66 mmHg  Pulse 86  Resp 14  Ht 6' (1.829 m)  Wt 264 lb 4 oz (119.863 kg)  BMI 35.83 kg/m2  SpO2 96%  Wt Readings from Last 3 Encounters:  02/17/14 264 lb 4 oz (119.863  kg)  02/05/14 252 lb 8 oz (114.533 kg)  01/31/12 252 lb 8 oz (114.533 kg)   GENERAL: No acute distress, well developed HEENT:  Eye exam shows normal external appearance. Oral exam shows normal mucosa .  NECK:   Neck exam shows no lymphadenopathy. No Carotids bruits. Thyroid is not enlarged and no nodules felt.  no acanthosis nigricans LUNGS:         Chest is symmetrical. Lungs are clear to auscultation.Marland Kitchen   HEART:         Heart sounds:  S1 and S2 are normal. No murmurs or clicks heard. ABDOMEN:  No Distention present. Liver and spleen are not palpable. No other mass or tenderness present. few lipomas. EXTREMITIES:     There is no edema. 2+ DP pulses  NEUROLOGICAL:     Grossly intact.            Diabetic foot exam done with shoes and socks removed: Normal Monofilament testing bilaterally. No deformities of toes.  Nails  Not dystrophic. Skin normal color. No open wounds. Dry skin.  MUSCULOSKELETAL:       There is no enlargement or gross deformity of the joints.  SKIN:       No rash  ASSESSMENT AND PLAN: Problem List Items Addressed This Visit      Cardiovascular and Mediastinum   HTN (hypertension)    BP at target today. Update urine MA at next few visits when the sugars are better.       Relevant Medications   Albiglutide 30 MG PEN   glucose blood (ONE TOUCH ULTRA TEST) test strip   ONETOUCH DELICA LANCETS 33G MISC     Endocrine   Diabetes mellitus, type II - Primary    Discussed new diagnosis of DM, dietary modifications, exercise, weight reduction.  Discussed home glucose monitoring and he will start checking his sugars 2 x daily. Mini meter given and training.  Discussed goal sugars and goal A1c.   Discussed DM education referral and he denied at this time. Is aware to let me know if he would need help in this regard.  In the interim, he will read about it online and was given info brochures.   Discussed long term complications and advised DEE this year. Foot care, hypoglycemia  discussed.   Discussed medication regimens for improving sugars. Since A1c is greater than 9%, offered to start second agent. He has elected to try weekly GLP-1 agent and will start Tanzeum weekly. Risk profile and side effects discussed. Report back if does not tolerate.   If his sugars stay elevated then will consider adding a daily basal insulin while the GLP-1 starts working better.  At present he does not wish to start the insulin and I dont have any data on his sugars.           Relevant Medications   Albiglutide 30 MG PEN   glucose blood (ONE TOUCH ULTRA TEST) test strip   ONETOUCH DELICA LANCETS 33G MISC       - Return to clinic in 3 weeks with sugar log/meter. 45 minutes spent with the patient, >50% time spent on discussion of topics mentioned above.   Quillian Quince [facility] 02/17/2014 3:43 PM

## 2014-02-17 NOTE — Patient Instructions (Signed)
Check sugars 2 x daily ( before breakfast and before supper).  Record them in a log book and bring that/meter to next appointment.   Continue current metformin. Add Tanzeum 30 mg once weekly.  Report back if dont tolerate due to nausea, vomiting, abdominal pain.   Reports back if sugars dont start to improve. Will consider adding basal insulin daily to control sugars while the other meds start to work better.   Please come back for a follow-up appointment in 3 weeks

## 2014-02-17 NOTE — Assessment & Plan Note (Signed)
Discussed new diagnosis of DM, dietary modifications, exercise, weight reduction.  Discussed home glucose monitoring and he will start checking his sugars 2 x daily. Mini meter given and training.  Discussed goal sugars and goal A1c.   Discussed DM education referral and he denied at this time. Is aware to let me know if he would need help in this regard.  In the interim, he will read about it online and was given info brochures.   Discussed long term complications and advised DEE this year. Foot care, hypoglycemia discussed.   Discussed medication regimens for improving sugars. Since A1c is greater than 9%, offered to start second agent. He has elected to try weekly GLP-1 agent and will start Tanzeum weekly. Risk profile and side effects discussed. Report back if does not tolerate.   If his sugars stay elevated then will consider adding a daily basal insulin while the GLP-1 starts working better.  At present he does not wish to start the insulin and I dont have any data on his sugars.

## 2014-02-24 ENCOUNTER — Telehealth: Payer: Self-pay

## 2014-02-24 ENCOUNTER — Other Ambulatory Visit: Payer: Self-pay | Admitting: *Deleted

## 2014-02-24 MED ORDER — ATORVASTATIN CALCIUM 40 MG PO TABS: 40.0000 mg | ORAL_TABLET | Freq: Every day | ORAL | Status: AC

## 2014-02-24 MED ORDER — LISINOPRIL 5 MG PO TABS: 5.0000 mg | ORAL_TABLET | Freq: Every day | ORAL | Status: AC

## 2014-02-24 MED ORDER — CARVEDILOL 25 MG PO TABS: 25.0000 mg | ORAL_TABLET | Freq: Two times a day (BID) | ORAL | Status: AC

## 2014-02-24 MED ORDER — DILTIAZEM HCL 30 MG PO TABS: 30.0000 mg | ORAL_TABLET | Freq: Four times a day (QID) | ORAL | Status: DC | PRN

## 2014-02-24 MED ORDER — FUROSEMIDE 20 MG PO TABS: 20.0000 mg | ORAL_TABLET | Freq: Two times a day (BID) | ORAL | Status: DC | PRN

## 2014-02-24 NOTE — Telephone Encounter (Signed)
Pt needs all of his cardiac medications refilled, states he does not know which ones they are, but we should knowl

## 2014-02-24 NOTE — Telephone Encounter (Signed)
Rx sent for all cardiac medications to local pharmacy.

## 2014-02-26 ENCOUNTER — Other Ambulatory Visit: Payer: Self-pay

## 2014-02-26 MED ORDER — METFORMIN HCL 1000 MG PO TABS: 1000.0000 mg | ORAL_TABLET | Freq: Two times a day (BID) | ORAL | Status: DC

## 2014-02-26 NOTE — Telephone Encounter (Signed)
Patient called stating that he was waiting on his metformin Rx to come in the mail and he is almost out of medication. Patient requested a month supply be sent to [facility] local pharmacy today. Rx has been sent.

## 2014-03-02 LAB — POCT GLUCOSE (DEVICE FOR HOME USE)

## 2014-03-10 ENCOUNTER — Ambulatory Visit (INDEPENDENT_AMBULATORY_CARE_PROVIDER_SITE_OTHER): Payer: BLUE CROSS/BLUE SHIELD | Admitting: Endocrinology

## 2014-03-10 ENCOUNTER — Ambulatory Visit: Payer: BLUE CROSS/BLUE SHIELD | Admitting: Endocrinology

## 2014-03-10 ENCOUNTER — Encounter: Payer: Self-pay | Admitting: Endocrinology

## 2014-03-10 VITALS — BP 112/82 | HR 77 | Resp 14 | Ht 72.0 in | Wt 257.0 lb

## 2014-03-10 DIAGNOSIS — I10 Essential (primary) hypertension: Secondary | ICD-10-CM

## 2014-03-10 DIAGNOSIS — E1169 Type 2 diabetes mellitus with other specified complication: Secondary | ICD-10-CM

## 2014-03-10 MED ORDER — INSULIN GLARGINE 300 UNIT/ML ~~LOC~~ SOPN: 20.0000 [IU] | PEN_INJECTOR | Freq: Every day | SUBCUTANEOUS | Status: AC

## 2014-03-10 NOTE — Progress Notes (Signed)
Reason for visit-  Brad Stephens is a 49 y.o.-year-old male, here for follow up management of Type 2 diabetes, uncontrolled, without complications. Associated hx CHF ( EF~30%), CMP, pAF.   HPI- Patient has been diagnosed with diabetes in Dec 2015 during recent hospitalization. Had prior preDM per patient. Hasn't seen DM education during recent hospitalization- referral denied at last visit  he has not been on insulin before.    Pt is currently on a regimen of: - Metformin 1000 mg po bid -Tanzeum  Brad Stephens weekly ( start Feb 2016) - 2 injections so far  *tolerating well. Last time, forgot to take the cap off the pen and could inject only half of the medication.     Last hemoglobin A1c was: 10.7% at [facility] in Dec 2015 Lab Results  Component Value Date   HGBA1C 10.7* 12/23/2013     Pt checks his sugars 1-2 a day . Uses One Touch mini glucometer. Forgot to bring his other meter. By meter download they are:  PREMEAL Breakfast Lunch Dinner Bedtime Overall  Glucose range: 175-230 395 214 244   Mean/median:        POST-MEAL PC Breakfast PC Lunch PC Dinner  Glucose range:     Mean/median:       Hypoglycemia-  No lows. Lowest sugar was n/a; he has hypoglycemia awareness at 70.   Dietary habits- eats three times daily. Tries to limit carbs, sweetened beverages, sodas, desserts. Juicing some, eats lots of vegetables. Loves fried foods but trying to avoid. Eating more meals at home.  Exercise- walks for exercise . Travels a lot.  Weight - down since last time Wt Readings from Last 3 Encounters:  03/10/14 257 lb (116.574 kg)  02/17/14 264 lb 4 oz (119.863 kg)  02/05/14 252 lb 8 oz (114.533 kg)    Diabetes Complications-  Nephropathy- No  CKD, last BUN/creatinine- GFR >60 Lab Results  Component Value Date   BUN 15 12/23/2013   CREATININE 0.8 12/23/2013    Retinopathy- No, Last DEE was in jan 2015- has follow up appt Neuropathy- no numbness and tingling in his feet. No known  neuropathy.  Associated history - No CAD- has other cardiac history as above . No prior stroke. No hypothyroidism. his last TSH was  Lab Results  Component Value Date   TSH 0.84 12/23/2013    Hyperlipidemia-  his last set of lipids were- Currently on Lipitor 40 mg ( recent start Dec 2015) Tolerating well.   Lab Results  Component Value Date   CHOL 111 12/23/2013   HDL 55 12/23/2013   LDLCALC 31 12/23/2013   TRIG 124 12/23/2013    Blood Pressure/HTN- Patient's blood pressure is well controlled today on current regimen that includes ACE-I.    I have reviewed the patient's past medical history, family and social history, surgical history, medications and allergies.   Current Outpatient Prescriptions on File Prior to Visit  Medication Sig Dispense Refill  . Albiglutide 30 MG PEN Inject 30 mg into the skin once a week. 4 each 3  . aspirin 81 MG tablet Take 1 tablet (81 mg total) by mouth daily. 30 tablet   . atorvastatin (LIPITOR) 40 MG tablet Take 1 tablet (40 mg total) by mouth daily. 90 tablet 3  . carvedilol (COREG) 25 MG tablet Take 1 tablet (25 mg total) by mouth 2 (two) times daily. 180 tablet 3  . diltiazem (CARDIZEM) 30 MG tablet Take 1 tablet (30 mg total) by mouth 4 (four) times  daily as needed. 120 tablet 3  . furosemide (LASIX) 20 MG tablet Take 1 tablet (20 mg total) by mouth 2 (two) times daily as needed. 180 tablet 3  . glucose blood (ONE TOUCH ULTRA TEST) test strip Use as instructed twice daily 100 each 12  . lisinopril (PRINIVIL,ZESTRIL) 5 MG tablet Take 1 tablet (5 mg total) by mouth daily. 90 tablet 3  . metFORMIN (GLUCOPHAGE) 1000 MG tablet Take 1 tablet (1,000 mg total) by mouth 2 (two) times daily with a meal. 60 tablet 0  . ONETOUCH DELICA LANCETS 33G MISC Use to test sugars twice daily 100 each 11   No current facility-administered medications on file prior to visit.   Allergies  Allergen Reactions  . Codeine   . Prednisone   . Shellfish Allergy       Review of Systems- [ x ]  Complains of    [  ]  denies [  ] Recent weight change [  ]  Fatigue [  ] polydipsia [  ] polyuria [  ]  nocturia [  ]  vision difficulty [  ] chest pain [  ] shortness of breath [  ] leg swelling [  ] cough [  ] nausea/vomiting [  ] diarrhea [  ] constipation [  ] abdominal pain [  ]  tingling/numbness in extremities [  ]  concern with feet ( wounds/sores)   PE: BP 112/82 mmHg  Pulse 77  Resp 14  Ht 6' (1.829 m)  Wt 257 lb (116.574 kg)  BMI 34.85 kg/m2  SpO2 97% Wt Readings from Last 3 Encounters:  03/10/14 257 lb (116.574 kg)  02/17/14 264 lb 4 oz (119.863 kg)  02/05/14 252 lb 8 oz (114.533 kg)   Exam: deferred  ASSESSMENT AND PLAN: Problem List Items Addressed This Visit      Cardiovascular and Mediastinum   HTN (hypertension)    BP at target today. Update urine MA at next few visits when the sugars are better.           Endocrine   Diabetes mellitus, type II - Primary    Sugars are still elevated and probably Tanzeum hasn't had enough time to act. Recent A1c elevated.  Continue checking his sugars 2 x daily.     He is going to follow up with opthalmology for DEE for this year.   Continue current metformin and Tanzeum. Discussed that for now, would like to avoid SGLT2 therapy due to CHF history. He is going to check with Dr Mariah MillingGollan to see if in the near future, his dose of diuretic can be weaned down further and possibly taken off. SGLT2 therapy can be revisited at later date, if he comes off the diuretic or his pump function recovers.  In the interim due to elevated sugars, will start with basal insulin, Toujeo at 20 units daily with plan to titrate up slowly based on his morning sugars. He is agreeable and saving card given.               Relevant Medications   Insulin Glargine (TOUJEO SOLOSTAR) 300 UNIT/ML SOPN       - Return to clinic in 4 weeks with sugar log/meter.   [facility] 03/10/2014 2:26  PM

## 2014-03-10 NOTE — Progress Notes (Signed)
Pre visit review using our clinic review tool, if applicable. No additional management support is needed unless otherwise documented below in the visit note. 

## 2014-03-10 NOTE — Assessment & Plan Note (Signed)
BP at target today. Update urine MA at next few visits when the sugars are better.  

## 2014-03-10 NOTE — Assessment & Plan Note (Signed)
Sugars are still elevated and probably Tanzeum hasn't had enough time to act. Recent A1c elevated.  Continue checking his sugars 2 x daily.     He is going to follow up with opthalmology for DEE for this year.   Continue current metformin and Tanzeum. Discussed that for now, would like to avoid SGLT2 therapy due to CHF history. He is going to check with Dr Mariah MillingGollan to see if in the near future, his dose of diuretic can be weaned down further and possibly taken off. SGLT2 therapy can be revisited at later date, if he comes off the diuretic or his pump function recovers.  In the interim due to elevated sugars, will start with basal insulin, Toujeo at 20 units daily with plan to titrate up slowly based on his morning sugars. He is agreeable and saving card given.

## 2014-03-10 NOTE — Patient Instructions (Signed)
Check sugars 2 x daily ( before breakfast and before supper).  Record them in a log book and bring that/meter to next appointment.   Continue current metformin, tanzeum.  Start Toujeo at 20 units daily at bedtime.  Increase by 2 units once weekly if your morning sugars are above 140 consistently.  Max dose 30 units daily.   As you start to notice lows, then give me a call for further adjustments.   Please come back for a follow-up appointment in 1 month.

## 2014-03-24 ENCOUNTER — Other Ambulatory Visit: Payer: Self-pay | Admitting: Endocrinology

## 2014-03-26 ENCOUNTER — Other Ambulatory Visit: Payer: Self-pay

## 2014-03-26 ENCOUNTER — Other Ambulatory Visit: Payer: Self-pay | Admitting: *Deleted

## 2014-03-26 MED ORDER — INSULIN PEN NEEDLE 31G X 8 MM MISC: Status: AC

## 2014-03-29 ENCOUNTER — Other Ambulatory Visit: Payer: Self-pay | Admitting: *Deleted

## 2014-03-29 MED ORDER — METFORMIN HCL 1000 MG PO TABS: 1000.0000 mg | ORAL_TABLET | Freq: Two times a day (BID) | ORAL | Status: DC

## 2014-04-13 ENCOUNTER — Ambulatory Visit (INDEPENDENT_AMBULATORY_CARE_PROVIDER_SITE_OTHER): Payer: BLUE CROSS/BLUE SHIELD | Admitting: Endocrinology

## 2014-04-13 ENCOUNTER — Encounter: Payer: Self-pay | Admitting: Endocrinology

## 2014-04-13 VITALS — BP 126/78 | HR 88 | Resp 14 | Ht 72.0 in | Wt 264.0 lb

## 2014-04-13 DIAGNOSIS — I10 Essential (primary) hypertension: Secondary | ICD-10-CM | POA: Diagnosis not present

## 2014-04-13 DIAGNOSIS — E1169 Type 2 diabetes mellitus with other specified complication: Secondary | ICD-10-CM

## 2014-04-13 LAB — HEMOGLOBIN A1C: Hgb A1c MFr Bld: 7.9 % — ABNORMAL HIGH (ref 4.6–6.5)

## 2014-04-13 LAB — MICROALBUMIN / CREATININE URINE RATIO
Creatinine,U: 248.9 mg/dL
Microalb Creat Ratio: 20 mg/g (ref 0.0–30.0)
Microalb, Ur: 49.7 mg/dL — ABNORMAL HIGH (ref 0.0–1.9)

## 2014-04-13 NOTE — Assessment & Plan Note (Signed)
Recent A1c elevated. Update today. Sugars are getting better, but not checking during the later part of the day and not checking enough.   Continue checking his sugars 2 x daily.   He is going to follow up with opthalmology for DEE for this year.   Continue current metformin and Tanzeum. If sugars are still in the 120+ range during the rest of the day without any lows, then could increase Toujeo to 22 units daily.   If he starts to notice low sugars over time, then he is aware to notify me for further changes. At next visit, if sugars are still good, could try to increase Tanzeum and wean off Toujeo.  He is going to try and see if day time administration of the insulin would be associated with better sleep patterns- I explained that this is not a typical side effect of this medication, but he can give it a try. He will follow back with his PCP for the sleep med and trial of Cpap again.

## 2014-04-13 NOTE — Progress Notes (Signed)
Pre visit review using our clinic review tool, if applicable. No additional management support is needed unless otherwise documented below in the visit note. 

## 2014-04-13 NOTE — Progress Notes (Signed)
Reason for visit-  Brad Stephens is a 49 y.o.-year-old male, here for follow up management of Type 2 diabetes, uncontrolled, without complications. Associated hx CHF ( EF~30%), CMP, pAF. Last visit 1 month ago.    HPI- Patient has been diagnosed with diabetes in Dec 2015 during recent hospitalization. Had prior preDM per patient. Hasn't seen DM education during recent hospitalization- referral denied at last visits  he had not been on insulin before.   *insulin started March 2016 due to high sugars   Pt is currently on a regimen of: - Metformin 1000 mg po bid -Tanzeum  [place] weekly ( start Feb 2016)  -Toujeo 20 units qhs ( start March 2016)  *tolerating well.  *reports insomnia lately. Reads a book on his Ipad prior to sleep. Used to use Cpap mask in the past, now not using it. Had tried Ambien in the past with good success.    Last hemoglobin A1c was: 10.7% at [facility] in Dec 2015 Lab Results  Component Value Date   HGBA1C 10.7* 12/23/2013     Pt checks his sugars 0.5 a day . Uses One Touch mini glucometer. By meter download they are:  *few readings in last 2 weeks  PREMEAL Breakfast Lunch Dinner Bedtime Overall  Glucose range: 87-136 128-166     Mean/median:        POST-MEAL PC Breakfast PC Lunch PC Dinner  Glucose range:     Mean/median:       Hypoglycemia-  No lows. Lowest sugar was n/a; he has hypoglycemia awareness at 70.   Dietary habits- eats three times daily. Tries to limit carbs, sweetened beverages, sodas, desserts. Juicing some, eats lots of vegetables. Loves fried foods but trying to avoid. Eating more meals at home.  Exercise- walks for exercise . Travels a lot.  Weight - back up since last time Wt Readings from Last 3 Encounters:  04/13/14 264 lb (119.75 kg)  03/10/14 257 lb (116.574 kg)  02/17/14 264 lb 4 oz (119.863 kg)    Diabetes Complications-  Nephropathy- No  CKD, last BUN/creatinine- GFR >60 Lab Results  Component Value Date   BUN 15  12/23/2013   CREATININE 0.8 12/23/2013   No results found for: GFR, MICRALBCREAT   Retinopathy- No, Last DEE was in jan 2015- has follow up appt this month Neuropathy- no numbness and tingling in his feet. No known neuropathy.  Associated history - No CAD- has other cardiac history as above . No prior stroke. No hypothyroidism. his last TSH was  Lab Results  Component Value Date   TSH 0.84 12/23/2013    Hyperlipidemia-  his last set of lipids were- Currently on Lipitor 40 mg ( recent start Dec 2015) Tolerating well.  Has appt with cards this April 2016.  Lab Results  Component Value Date   CHOL 111 12/23/2013   HDL 55 12/23/2013   LDLCALC 31 12/23/2013   TRIG 124 12/23/2013    Blood Pressure/HTN- Patient's blood pressure is well controlled today on current regimen that includes ACE-I.    I have reviewed the patient's past medical history, family and social history, surgical history, medications and allergies.   Current Outpatient Prescriptions on File Prior to Visit  Medication Sig Dispense Refill  . Albiglutide 30 MG PEN Inject 30 mg into the skin once a week. 4 each 3  . aspirin 81 MG tablet Take 1 tablet (81 mg total) by mouth daily. 30 tablet   . atorvastatin (LIPITOR) 40 MG tablet Take 1  tablet (40 mg total) by mouth daily. 90 tablet 3  . carvedilol (COREG) 25 MG tablet Take 1 tablet (25 mg total) by mouth 2 (two) times daily. 180 tablet 3  . diltiazem (CARDIZEM) 30 MG tablet Take 1 tablet (30 mg total) by mouth 4 (four) times daily as needed. 120 tablet 3  . furosemide (LASIX) 20 MG tablet Take 1 tablet (20 mg total) by mouth 2 (two) times daily as needed. 180 tablet 3  . glucose blood (ONE TOUCH ULTRA TEST) test strip Use as instructed twice daily 100 each 12  . Insulin Glargine (TOUJEO SOLOSTAR) 300 UNIT/ML SOPN Inject 20 Units into the skin at bedtime. 4.5 mL 3  . Insulin Pen Needle 31G X 8 MM MISC Use with Toujeo pens. 100 each 3  . lisinopril (PRINIVIL,ZESTRIL) 5 MG  tablet Take 1 tablet (5 mg total) by mouth daily. 90 tablet 3  . metFORMIN (GLUCOPHAGE) 1000 MG tablet Take 1 tablet (1,000 mg total) by mouth 2 (two) times daily with a meal. 180 tablet 2  . ONETOUCH DELICA LANCETS 33G MISC Use to test sugars twice daily 100 each 11   No current facility-administered medications on file prior to visit.   Allergies  Allergen Reactions  . Codeine   . Prednisone   . Shellfish Allergy      Review of Systems- [ x ]  Complains of    [  ]  denies [  ] Recent weight change [  ]  Fatigue [  ] polydipsia [  ] polyuria [  ]  nocturia [  ]  vision difficulty [  ] chest pain [  ] shortness of breath [  ] leg swelling [  ] cough [  ] nausea/vomiting [  ] diarrhea [  ] constipation [  ] abdominal pain [  ]  tingling/numbness in extremities [  ]  concern with feet ( wounds/sores)   PE: BP 126/78 mmHg  Pulse 88  Resp 14  Ht 6' (1.829 m)  Wt 264 lb (119.75 kg)  BMI 35.80 kg/m2  SpO2 97% Wt Readings from Last 3 Encounters:  04/13/14 264 lb (119.75 kg)  03/10/14 257 lb (116.574 kg)  02/17/14 264 lb 4 oz (119.863 kg)   Exam: deferred  ASSESSMENT AND PLAN: Problem List Items Addressed This Visit      Cardiovascular and Mediastinum   HTN (hypertension)    BP at target today. Update urine MA at this visit.            Relevant Orders   Hemoglobin A1c   Microalbumin / creatinine urine ratio     Endocrine   Diabetes mellitus, type II - Primary     Recent A1c elevated. Update today. Sugars are getting better, but not checking during the later part of the day and not checking enough.   Continue checking his sugars 2 x daily.   He is going to follow up with opthalmology for DEE for this year.   Continue current metformin and Tanzeum. If sugars are still in the 120+ range during the rest of the day without any lows, then could increase Toujeo to 22 units daily.   If he starts to notice low sugars over time, then he is aware to notify me for further  changes. At next visit, if sugars are still good, could try to increase Tanzeum and wean off Toujeo.  He is going to try and see if day time administration of the insulin would  be associated with better sleep patterns- I explained that this is not a typical side effect of this medication, but he can give it a try. He will follow back with his PCP for the sleep med and trial of Cpap again.                  Relevant Orders   Hemoglobin A1c   Microalbumin / creatinine urine ratio       - Return to clinic in 3 months with sugar log/meter.   Renne Platts [facility] 04/13/2014 3:05 PM

## 2014-04-13 NOTE — Assessment & Plan Note (Signed)
BP at target today. Update urine MA at this visit.

## 2014-04-13 NOTE — Patient Instructions (Signed)
Check sugars 2 x daily ( before breakfast and before supper).  Record them in a log book and bring that/meter to next appointment.   If sugars are still in the 130 range at later part of the day, then increase Toujeo to 22 units daily.  If planning on switching to day time regimen with Toujeo, then take half dose night prior and the other half dose the next morning, followed by the full dose the morning after.   A1c today.  Please come back for a follow-up appointment in 3 months

## 2014-04-14 ENCOUNTER — Other Ambulatory Visit: Payer: Self-pay | Admitting: *Deleted

## 2014-04-14 MED ORDER — DILTIAZEM HCL 30 MG PO TABS: 30.0000 mg | ORAL_TABLET | Freq: Four times a day (QID) | ORAL | Status: DC | PRN

## 2014-04-24 ENCOUNTER — Other Ambulatory Visit: Payer: Self-pay | Admitting: Endocrinology

## 2014-04-27 NOTE — Discharge Summary (Signed)
PATIENT NAME:  Brad MaclachlanSAURAGE, Lemarcus MR#:  [id number] DATE OF BIRTH:  1965/08/22  DATE OF ADMISSION:  12/30/2011 DATE OF DISCHARGE:  12/31/2011  PRIMARY CARE PHYSICIAN: nonlocal  CONSULTING PHYSICIAN: Dr. Mariah MillingGollan.  REASON FOR ADMISSION: Shortness of breath.   DISCHARGE DIAGNOSES:  1. Acute systolic dysfunction with ejection fraction less than 25%.  2. Cardiomyopathy.  3. Paroxysmal atrial fibrillation. 4. Hypertension.  5. Diabetes.  6. Hyperlipidemia.   CONDITION: Stable.   CODE STATUS: Full code.   HOME MEDICATIONS: Lisinopril 20 mg p.o. daily, metformin 500 mg p.o. b.i.d., Coreg 6.25 mg p.o. b.i.d., Lasix 20 mg p.o. b.i.d., atorvastatin 40 mg p.o. at bedtime, aspirin 81 mg p.o. daily.   ACTIVITY: As tolerated.   DIET: Low sodium, low fat, low cholesterol, carbohydrate-controlled ADA diet.   FOLLOW-UP CARE: Follow up with PCP within 1 to 2 weeks. Follow up with Dr. Mariah MillingGollan within one week.   HOSPITAL COURSE:  1. The patient is a 49 year old gentleman, Caucasian male, with a history of hypertension, gout but not on any medication, presented to the ED with shortness of breath, cough, sputum for three days, worsening one day. The patient also had some mild chest pain while taking deep breaths. On admission date, the patient's troponin was 0.03. CBC was normal. Glucose 254, BUN 12, creatinine 0.73. BNP 847. Chest x-ray showed bilateral diffuse interstitial thickening. EKG showed sinus tachycardia with left atrial enlargement at 106 BPM. The patient was admitted for possible acute congestive heart failure with pulmonary edema, tachycardia, need to rule out a PE. For detailed history and physical examination, please refer to the admission note dictated by me. After admission, the patient had been treated with Lasix 20 mg IV b.i.d., lisinopril, Coreg. The patient got an echocardiograph which showed ejection fraction less than 25%, systolic dysfunction. Lipid panel showed hyperlipidemia. TSH is  normal. Since the patient's D-dimer is elevated about 1.6, the patient got CT angio of chest to rule out PE which showed no evidence of PE, but the patient developed AFib last night, at 0160 last night, was treated with Cardizem 20 mg IV push. Heart rate decreased to about 100. Dr. Mariah MillingGollan evaluated the patient today, suggests that the patient needs Coreg 6.25 mg p.o. b.i.d., lisinopril 20 mg p.o. daily with Lasix 20 mg b.i.d.  2. Diabetes. The patient has no history of diabetes, but this time the patient's blood sugar is elevated. Hemoglobin was 10. The patient is diagnosed with diabetes. We started metformin 500 mg p.o. b.i.d. and sliding scale.  3. For hypertension. The patient has been treated with lisinopril and Coreg.  4. Obesity and hyperlipidemia. Otherwise the patient is on diet control.  5. The patient wants to go home today. Discussed with Dr. Mariah MillingGollan. He suggests the patient needs above-mentioned medication and heart monitor as outpatient. I discussed the patient's discharge plan with the patient and otherwise the patient is on diet control, daily weight, fluid restriction, and be compliant to following up with PCP and Dr. Mariah MillingGollan.   TIME SPENT: About 45 minutes.     ____________________________ Shaune PollackQing Kynzie Polgar, MD qc:es D: 12/31/2011 15:22:02 ET T: 01/01/2012 08:28:10 ET JOB#: [id number]  cc: Shaune PollackQing Jacilyn Sanpedro, MD, <Dictator> Shaune PollackQING Deunte Bledsoe MD ELECTRONICALLY SIGNED 01/02/2012 16:11

## 2014-04-27 NOTE — H&P (Signed)
DATE OF BIRTH:  Dec 02, 1965  PULMONARY CARE PHYSICIAN:  nonlocal  REFERRING PHYSICIAN:  Dr. Cyril Loosen  CHIEF COMPLAINT:  Shortness of breath, cough, sputum for 3 days.   HISTORY OF PRESENT ILLNESS:  A 49 year old Caucasian male with a history of hypertension, gout, but not on any medication, presented to the ED with shortness of breath, cough, sputum for 3 days. The patient is alert, awake, oriented, in no acute distress. The patient said that he had had shortness of breath, cough, sputum for the past 3 days, has chest pain while taking deep breaths. The patient was diagnosed with sinusitis and bronchitis, was treated without antibiotics without relief. The patient said he has no fever or chills. No headache or dizziness. No leg edema or tenderness, but has orthopnea. The patient cannot lie flat. In addition, the patient said that he traveled a lot recently by flight. In the ED, the patient had chest x-ray showing pulmonary edema.   PAST MEDICAL HISTORY:  Hypertension, gout.   PAST SURGICAL HISTORY:  None.   FAMILY HISTORY:  No hypertension, diabetes, heart attack or stroke.   ALLERGIES:  None.   MEDICATIONS:  None.   REVIEW OF SYSTEMS:  CONSTITUTIONAL:  The patient denies any fever or chills. No headache or dizziness. No weakness.  EYES:  No double vision or blurred vision.  EARS, NOSE, THROAT:  No postnasal drip, slurred speech or dysphagia. No epistaxis.  CARDIOVASCULAR:  Has chest pain while taking deep breaths. No palpitations, leg edema or nocturnal dyspnea, but has orthopnea.  PULMONARY:  Positive for cough, sputum, shortness of breath and a pinkish sputum.  GASTROINTESTINAL:  No abdominal pain, nausea, vomiting or diarrhea. No melena or bloody stool.  GENITOURINARY:  No dysuria, hematuria or incontinence.  SKIN:  No rash or jaundice.  ENDOCRINOLOGY:  No polyuria, polydipsia, heat or cold intolerance.  NEUROLOGY:  No syncope, loss of consciousness or seizure.   PHYSICAL  EXAMINATION:  VITAL SIGNS:  Temperature 97.9, blood pressure 122/80, O2 saturation 96% in room air, pulse 104, respirations 20.  GENERAL:  The patient is alert, awake, oriented, in no acute distress.  HEENT:  Pupils round, equal, reactive to light and accommodation.  NECK:  Supple. No JVD or carotid bruits. No lymphadenopathy. No thyromegaly. Moist oral mucosa. Clear oropharynx.  CARDIOVASCULAR:  S1, S2. Regular rate, rhythm, tachycardia. No murmurs or gallops.  PULMONARY:  Bilateral air entry. No wheezing, but has some mild rales at  bilateral bases. No use of accessory muscles to breathe.  ABDOMEN:  Soft, obese. No distention or tenderness. No organomegaly. Bowel sounds present.  EXTREMITIES:  No edema, clubbing or cyanosis. No calf tenderness. Strong bilateral pedal pulses.  SKIN:  No rash or jaundice.  NEUROLOGY:  Oriented x 3. No focal deficit. Power 5/5. Sensation intact. DTR 2+.  LABORATORY DATA:  Troponin 0.03, CK 49, CK-MB less than 0.5. CBC normal. Glucose 254, BUN 12, creatinine 0.73. Electrolytes normal. BNP 847. Chest x-ray showed bilateral diffuse interstitial thickening, likely representing interstitial edema or interstitial pneumonitis. EKG showed sinus tachycardia, possible left atrial enlargement at 106 bpm.   IMPRESSIONS:  1.  Possible acute congestive heart failure.  2.  Pulmonary edema.  3.  Tachycardia.  4.  Rule out pulmonary embolism.  5.  Hypertension.  6.  Rule out diabetes.  7.  Obesity.   PLAN OF TREATMENT:   1.  The patient will be admitted to the telemetry floor. We will continue oxygen by [place]. Start congestive heart failure protocol.  We will give Lasix 20 mg IV b.i.d. and start lisinopril, Coreg and aspirin. We will check lipid panel, TSH and echocardiograms.  2.  To rule out pulmonary embolism, we will start Lovenox 1 mg/kg subcutaneous every 12 hours, and we will get a CT angiogram of chest.  3.  To rule out diabetes, we will start a sliding scale and check  hemoglobin A1c.  4.  Gastrointestinal prophylaxis.  5.  Discussed the patient's situation and plan of treatment with the patient.   TIME SPENT: About 57 minutes.   ____________________________ Shaune PollackQing Kyion Gautier, MD qc:ms D: 12/30/2011 12:03:22 ET T: 12/30/2011 14:39:28 ET JOB#: [id number]  cc: Shaune PollackQing Jaylaa Gallion, MD, <Dictator> Shaune PollackQING Iban Utz MD ELECTRONICALLY SIGNED 12/31/2011 16:49

## 2014-04-27 NOTE — Consult Note (Signed)
General Aspect 49 year old Caucasian male with a history of hypertension, gout, but not on any medication, presented to the ED with shortness of breath, cough, sputum for 3 days. Cardiology was consulted for SOB, atrial fibrillation.  The patient is alert, awake, oriented, in no acute distress. The patient said that he had had shortness of breath, cough, sputum for the past 3 days, has chest pain while taking deep breaths. The patient was diagnosed with sinusitis and bronchitis, was treated without antibiotics without relief. The patient said he has no fever or chills. No headache or dizziness. No leg edema or tenderness, but has orthopnea. The patient cannot lie flat. In addition, the patient said that he traveled a lot recently by flight. In the ED, the patient had chest x-ray showing pulmonary edema.   PAST MEDICAL HISTORY:  Hypertension, gout.   PAST SURGICAL HISTORY:  None.   FAMILY HISTORY:  No hypertension, diabetes, heart attack or stroke.   ALLERGIES:  None.   MEDICATIONS:  None.   Physical Exam:   GEN well developed, well nourished, no acute distress    HEENT red conjunctivae    NECK supple  No masses    RESP normal resp effort  clear BS    CARD Regular rate and rhythm  No murmur    ABD denies tenderness  soft    LYMPH negative neck    EXTR negative edema    NEURO motor/sensory function intact    PSYCH alert, A+O to time, place, person, good insight   Review of Systems:   Subjective/Chief Complaint SOB, cough    General: Fatigue    Skin: No Complaints    ENT: No Complaints    Eyes: No Complaints    Neck: No Complaints    Respiratory: Frequent cough  Short of breath    Cardiovascular: Dyspnea    Gastrointestinal: No Complaints    Genitourinary: No Complaints    Vascular: No Complaints    Musculoskeletal: No Complaints    Neurologic: No Complaints    Hematologic: No Complaints    Endocrine: No Complaints    Psychiatric: No Complaints     Review of Systems: All other systems were reviewed and found to be negative    Medications/Allergies Reviewed Medications/Allergies reviewed     gout:    htn:        Admit Diagnosis:   CHF: 31-Dec-2011, Active, CHF      Admit Reason:   CHF (congestive heart failure): (428.0) Active, ICD9, Congestive heart failure, unspecified  Home Medications: Medication Instructions Status  Levaquin 750 mg oral tablet 1 tab(s) orally every 24 hours for 7 days  Active  Colcrys 0.6 mg oral tablet 1  orally , As Needed Active  Ambien 10 mg oral tablet 1 tab(s) orally once a day (at bedtime), As Needed- for Inability to Sleep  Active  ProAir HFA 90 mcg/inh inhalation aerosol 2 puff(s) inhaled 4 times a day Active   EKG:   Interpretation EKG shows sius tachycardia with rate 106 bpm, no significant ST or T wave changes    No Known Allergies:   Vital Signs/Nurse's Notes: **Vital Signs.:   23-Dec-13 08:00   Vital Signs Type Routine   Temperature Temperature (F) 97.6   Celsius 36.4   Temperature Source oral   Pulse Pulse 98   Respirations Respirations 18   Systolic BP Systolic BP 118   Diastolic BP (mmHg) Diastolic BP (mmHg) 80   Mean BP 92   Pulse Ox %  Pulse Ox % 92   Pulse Ox Activity Level  At rest   Oxygen Delivery Room Air/ 21 %     Impression 49 year old Caucasian male with a history of hypertension, gout, but not on any medication, presented to the ED with shortness of breath, cough, sputum for 3 days. Cardiology was consulted for SOB, atrial fibrillation.  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  3) HTN; increase lisinopril and coreg Will follow up as an outpt  4) Pleural effusion: from Acute systolic CHF   Electronic Signatures: Julien Nordmann (MD)  (Signed 23-Dec-13 14:29)  Authored: General Aspect/Present Illness, History and Physical Exam, Review of System, Past Medical History, Health Issues, Home Medications, EKG , Allergies, Vital Signs/Nurse's Notes, Impression/Plan   Last Updated: 23-Dec-13 14:29 by Julien Nordmann (MD)

## 2014-04-28 ENCOUNTER — Encounter: Payer: Self-pay | Admitting: Endocrinology

## 2014-05-01 NOTE — Consult Note (Signed)
Present Illness Patient is a 49 year old male with history of hypertension, diabetes and alcohol abuse who is admitted after the presenting to the emergency room with complaints of shortness of breath noted to be in rapid atrial fibrillation.  Atrial fibrillation had not been noted previously.  Patient had been off of all of his medications for several months prior 2 at admission.  He admits to drinking approximately 12 beers a day.  His last drink was approximately 2 days ago.  His serum glucose on   admission was 350. he complains of midsternal chest pain relieved with narcotics.  Patient is currently complaining of intermittent chest pain.  He has converted to normal sinus rhythm.  Echocardiogram done today reveals moderately to severely reduced left ventricular function with ejection fraction of approximately 20-25%.  He has moderate MR and TR.  Chest x-ray does not show significant pulmonary edema.   Physical Exam:  GEN well nourished   HEENT PERRL   NECK supple   RESP normal resp effort   CARD Irregular rate and rhythm  Tachycardic  Murmur   Murmur Systolic   Systolic Murmur axilla   ABD denies tenderness  no Adominal Mass   LYMPH negative neck, negative axillae   EXTR negative cyanosis/clubbing   SKIN normal to palpation   NEURO cranial nerves intact, motor/sensory function intact   PSYCH A+O to time, place, person   Review of Systems:  Subjective/Chief Complaint Midsternal chest pain, shortness of breath and fatigue   General: Fatigue  Weakness   Skin: No Complaints   ENT: No Complaints   Eyes: No Complaints   Neck: No Complaints   Respiratory: Short of breath   Cardiovascular: Chest pain or discomfort  Palpitations   Gastrointestinal: No Complaints   Genitourinary: No Complaints   Vascular: No Complaints   Musculoskeletal: No Complaints   Neurologic: No Complaints   Hematologic: No Complaints   Endocrine: No Complaints   Psychiatric: Anxiety    Review of Systems: All other systems were reviewed and found to be negative   Medications/Allergies Reviewed Medications/Allergies reviewed   Family & Social History:  Family and Social History:  Social History positive  tobacco, positive  tobacco (Current within 1 year), positive ETOH   + Tobacco Current (within 1 year)   EKG:  Abnormal NSSTTW changes   Interpretation is atrial flutter with 4-1 and 2-1 conduction    No Known Allergies:    Impression 49 year old male with history diabetes, hypertension the and substance abuse including ethanol and tobacco was admitted after presenting with shortness of breath and fatigue.  He was noted to be in atrial fibrillation with rapid ventricular response.  He was placed back on his medications and he has converted to sinus rhythm currently.  His serum glucose is 350 on admission.  The echo shows reduced left ventricular function.  This is likely a combination of possible rate-related changes verses ethanol related.  He has no significant serum troponin elevation.  Patient complains of chest pain which is a lead with narcotics.  Will need to  resume home medications to treat hypertension, diabetes and advise to reduce ethanol intake rapidly 2 discontinuing this habit due to his cardiomyopathy.  Will need afterload reduction as well as beta blocker therapy.  Patient's hypotension well preclude use of afterload reduction at present.  Has heart rate improves, consideration for adding this preparation.  Given his hyperglycemia, patient appears relatively dehydrated.  Would [place] hydrate as well.   Plan 1. Continue with  metoprolol a current rate 2. Agree with holding ACE-inhibitor for now given his hypotension 3. Gently hydrate 4.  resume treatment of diabetes 5. ethanol intake will need to be stopped.  Patient only be closely watched for  withdrawal symptoms 6. further recommendations pending course   Electronic Signatures: Dalia HeadingFath, Ashland Wiseman A (MD)   (Signed 15-Dec-15 17:36)  Authored: General Aspect/Present Illness, History and Physical Exam, Review of System, Family & Social History, EKG , Allergies, Impression/Plan   Last Updated: 15-Dec-15 17:36 by Dalia HeadingFath, Parthiv Mucci A (MD)

## 2014-05-01 NOTE — H&P (Signed)
PATIENT NAME:  Brad Stephens, Brad Stephens MR#:  [id number] DATE OF BIRTH:  22-Jun-1965  DATE OF ADMISSION:  12/22/2013  PRIMARY CARE PHYSICIAN: Nonlocal.    REFERRING PHYSICIAN: Darien Ramus, MD    CHIEF COMPLAINT: Shortness of breath, chest pain.   HISTORY OF PRESENT ILLNESS: Brad Stephens, a 49 year old male, with a history of diabetes mellitus, hypertension, has not been taking his medications in the last few months, has been having cough condition for the last 1 week. The patient has been taking over-the-counter decongestant. Last night patient went to bed, started to experience chest pain in the mid sternal area, 10/10 intensity, associated with shortness of breath. The pain resolved after some time. The patient woke up this morning, went around to do his errands, started to experience again chest pain in the mid sternal area associated with severe shortness of breath. Concerning this, came to the Emergency Department.   The patient states the pain is worse with taking a deep breath and also with exertion. Workup in the Emergency Department with EKG showed the patient was in atrial fibrillation with a rapid ventricular rate. The patient also has elevated BNP of 1200. The patient denies any previous episodes of cardiac problems. No previous workup was done. The patient was also noted to have blood sugars of 350. The patient was given 2 doses of IV diltiazem with some heart rate control. The patient also received 2 doses of Dilaudid for his chest pain. The patient did not notice to have any increased swelling in the lower extremities. The patient drinks alcohol 3-4 beers to 12 beers a day, last drink was 2 days back.   PAST MEDICAL HISTORY:  1.  Hypertension.  2.  Diabetes mellitus.  3.  Gout.   PAST SURGICAL HISTORY: None.   ALLERGIES: No known drug allergies.   HOME MEDICATIONS:  1.  Aspirin 81 mg daily.  2.  Lisinopril 10 mg daily.   SOCIAL HISTORY: No history of smoking. Drinks alcohol on a  regular basis. Denies using any illicit drugs. Works in [employer].  Married.  Lives with his wife and children.   FAMILY HISTORY: Mother died of lung cancer. Father 61 is healthy. Has a grandfather who lived up to 9.   REVIEW OF SYSTEMS:   CONSTITUTIONAL: Experiences generalized weakness.  EYES: No change in vision.  EARS, NOSE, AND THROAT: No change in hearing.  RESPIRATORY: Has cough, mild shortness of breath.  CARDIOVASCULAR: Has chest pain.  GASTROINTESTINAL: Has no nausea, vomiting, abdominal pain.  GENITOURINARY: No dysuria or hematuria.  HEMATOLOGIC: No acute bruising or bleeding.  SKIN: No rash or lesions.  MUSCULOSKELETAL: No joint pains and aches.  NEUROLOGIC: No weakness or numbness in any part of the body.   PHYSICAL EXAMINATION:  GENERAL: This is a well-built, well-nourished, obese male laying down in the bed not in distress.  VITAL SIGNS: Temperature 98.9, pulse 104, blood pressure 98/74, respiratory rate of 22, oxygen saturation 96% on room air.  HEENT: Head normocephalic, atraumatic. There is no scleral icterus. Conjunctivae normal. Pupils equal and react to light. Mucous membranes moist. No pharyngeal erythema.  NECK: Supple. No lymphadenopathy. No JVD, no carotid bruits. No thyromegaly.  CHEST: No focal tenderness. Mild crackles are heard in the bilateral lower lobes.  HEART: S1, S2 regular. No murmurs are heard.  ABDOMEN: Bowel sounds plus, soft, nontender, nondistended. No hepatosplenomegaly.  EXTREMITIES: One to 2+ pitting edema around the ankles.  SKIN: No rash or lesions.  MUSCULOSKELETAL: Good range of motion  in all the extremities.  NEUROLOGIC: The patient is alert, oriented to place, person, and time. Cranial nerves II-XII intact. Motor 5/5 in upper and lower extremities.   LABORATORY DATA: Troponin 0.02. CBC, CMP are completely within normal limits except for blood sugars of 359. CTA of the chest did not show any PE, 5 mm right apical nodule, recommended follow  up with CT in 6-12 months.   EKG, 12-lead: Atrial flutter with a heart rate of 120.   ASSESSMENT AND PLAN:  1.  Atrial fibrillation with rapid ventricular rate, new onset. Continue with metoprolol 12.5 mg b.i.d. considering patient's low normal blood pressure. Check the TSH. The patient drinks alcohol, which could be contributing to it.  We will also continue to cycle cardiac enzymes x 3. Admit the patient to the monitored bed.  2.  Congestive heart failure, again new onset. This could be secondary to alcohol abuse causing the alcohol-induced cardiomyopathy. Discussed with the patient. The patient expressed understanding. We will keep the patient on IV Lasix and follow up.  3.  Diabetes mellitus, poorly controlled. We will obtain hemoglobin A1c. Keep the patient on sliding scale insulin for now.  Consider discharging the patient with metformin.  4.  Chest pain. This seems to be more of a pleuritic pain, however, considering the chest pain with exertion.   5.  Alcohol abuse.  Keep the patient on thiamine.  We will continue to follow up if the patient will develop any signs of withdrawal.   6.  Hypertension. Continue with lisinopril.  7.  Keep the patient on deep vein thrombosis prophylaxis with Lovenox.   TIME SPENT: 60 minutes.   ____________________________ Susa GriffinsPadmaja Kynedi Profitt, MD pv:AT D: 12/22/2013 02:58:41 ET T: 12/22/2013 03:54:06 ET JOB#: [id number]  cc: Susa GriffinsPadmaja Doniesha Landau, MD, <Dictator> Susa GriffinsPADMAJA Jacksyn Beeks MD ELECTRONICALLY SIGNED 01/04/2014 22:09

## 2014-05-05 NOTE — Discharge Summary (Signed)
PATIENT NAME:  Brad Stephens, Brad Stephens MR#:  [id number] DATE OF BIRTH:  [birth year]  DATE OF ADMISSION:  12/22/2013 DATE OF DISCHARGE:  12/24/2013  ADMITTING PHYSICIAN: Susa GriffinsPadmaja Vasireddy, MD.   DISCHARGING PHYSICIAN: Enid Baasadhika Kymora Sciara, MD.   PRIMARY CARE PHYSICIAN: None.   CONSULTATIONS IN THE HOSPITAL: Cardiology consultation with Dr. Lady GaryFath.   DISCHARGE DIAGNOSES: 1. Pleuritic chest pain on admission, which resolved.  2. Chronic systolic congestive heart failure, ejection fraction of 20%.  3. 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  4. Paroxysmal atrial fibrillation.  5. Hypotension, low normal blood pressure at baseline.  6. Diabetes mellitus.   DISCHARGE HOME MEDICATIONS:  1. Aspirin 81 mg p.o. daily.  2. Lisinopril 5 mg p.o. daily.  3. Coreg 12.5 mg p.o. b.i.d.  4. Atorvastatin 40 mg p.o. daily.  5. Metformin 1000 mg p.o. b.i.d.  6. Lasix 20 mg p.o. daily.  7. Tessalon Perles 100 mg every 8 hours p.r.n. for cough.  8. Tramadol 50 mg p.o. every 8 p.r.n. for chest pain.   DISCHARGE DIET: Low-sodium, ADA, 1800 calorie diet.   DISCHARGE ACTIVITY: As tolerated.    FOLLOWUP INSTRUCTIONS: 1. Advised to be compliant with his medications.  2. Fluid restriction of less than 1500 mL per day and weight checks.  3. Follow up with Dr. Mariah MillingGollan in 1 week.  4. PCP follow-up in 1 to 2 weeks.  5. Advised to stop alcohol.  LABORATORIES STUDIES: Prior to discharge: Sodium 131, potassium 4.2, chloride 104, bicarbonate 25, BUN 20, creatinine 0.80, glucose 240 and calcium of 8.6. Magnesium 2.0. LDL cholesterol 31, HDL 55, total cholesterol [id number], triglycerides 124. TSH 0.84 which is within normal limits. WBC 9.1, hemoglobin 13.1, hematocrit 41.4, platelet count is 156,000. Hemoglobin A1c is 10.7.   IMAGING STUDIES: Prior to discharge: Echo Doppler showing LV ejection fraction is 20% to 25%, severely decreased global left ventricular systolic function and mild mitral valve  regurgitation noted.   BRIEF HOSPITAL COURSE: Brad Stephens is a 49 year old, Caucasian male with a past medical history significant for significant alcohol abuse and also a diagnosis of nonischemic alcohol-induced cardiomyopathy with EF of 20% to 25% when last seen by Dr. Mariah MillingGollan in his office in 01/2012. Obviously, when patient was admitted with chest pain, he did not reveal this, so it was thought that he had a new diagnosis of congestive heart failure.   1. Chest pain on admission, likely from acute bronchitis, probably musculoskeletal, pleuritic-type of chest pain, which improved at the time of discharge. He is being discharged on tramadol p.r.n. and also some cough medicines as needed. The patient had an outpatient stress test last year, which was negative. It was done with Dr. Julien Nordmannimothy Gollan in his office. His cardiomyopathy was deemed to be nonischemic secondary to alcohol induced.  2. Chronic systolic CHF, alcohol-induced nonischemic cardiomyopathy. Outpatient test negative. Seen Dr. Mariah MillingGollan last year, but he did not indicate that on admission, so he was seen by Dr. Lady GaryFath in the hospital. Initially Lasix was held because he appeared dehydrated; however, chest x-ray showed some congestion and his dyspnea started coming, so Lasix is being restarted at the time of discharge. He is also being discharged on Coreg and low-dose lisinopril if his blood pressure can tolerate. He is also being discharged on a statin.  3. Paroxysmal atrial fibrillation. Had a halter monitor as an outpatient when seen by Dr. Mariah MillingGollan last year, actually it was a 30 day monitor which did not show any arrhythmias. Long-term anticoagulation had not been discussed  at the time because of his significant alcohol abuse. The patient came in with Afib, likely secondary to URI symptoms, however, converted back to normal sinus rhythm, rate better controlled with Coreg at this time. Again, anticoagulation not started because of his alcohol abuse and  noncompliance history. He is advised to follow up with cardiology as an outpatient. He is on aspirin at this time.  4. Diabetes mellitus, noncompliant with medications. Was on glipizide here in the hospital, but restarted back on his home medication of metformin, which he used to take in the past. 5. His course has been otherwise uneventful in the hospital.   DISCHARGE CONDITION: Stable.   DISCHARGE DISPOSITION: Home.   TIME SPENT ON DISCHARGE: 40 minutes.   ____________________________ Enid Baas, MD rk:TT D: 12/25/2013 14:50:51 ET T: 12/25/2013 22:13:13 ET JOB#: [id number]  cc: Enid Baas, MD, <Dictator> Antonieta Iba, MD Enid Baas MD ELECTRONICALLY SIGNED 01/12/2014 14:47

## 2014-05-06 ENCOUNTER — Ambulatory Visit (INDEPENDENT_AMBULATORY_CARE_PROVIDER_SITE_OTHER): Payer: BLUE CROSS/BLUE SHIELD | Admitting: Cardiovascular Disease

## 2014-05-06 ENCOUNTER — Encounter: Payer: Self-pay | Admitting: Cardiovascular Disease

## 2014-05-06 VITALS — BP 120/80 | HR 94 | Ht 72.0 in | Wt 260.5 lb

## 2014-05-06 DIAGNOSIS — I480 Paroxysmal atrial fibrillation: Secondary | ICD-10-CM

## 2014-05-06 DIAGNOSIS — I10 Essential (primary) hypertension: Secondary | ICD-10-CM | POA: Diagnosis not present

## 2014-05-06 DIAGNOSIS — E1169 Type 2 diabetes mellitus with other specified complication: Secondary | ICD-10-CM | POA: Diagnosis not present

## 2014-05-06 DIAGNOSIS — I5022 Chronic systolic (congestive) heart failure: Secondary | ICD-10-CM | POA: Diagnosis not present

## 2014-05-06 DIAGNOSIS — G4700 Insomnia, unspecified: Secondary | ICD-10-CM

## 2014-05-06 DIAGNOSIS — F1010 Alcohol abuse, uncomplicated: Secondary | ICD-10-CM

## 2014-05-06 MED ORDER — POTASSIUM CHLORIDE ER 10 MEQ PO TBCR: 10.0000 meq | EXTENDED_RELEASE_TABLET | Freq: Every day | ORAL | Status: AC | PRN

## 2014-05-06 NOTE — Assessment & Plan Note (Signed)
We have encouraged continued exercise, careful diet management in an effort to lose weight. 

## 2014-05-06 NOTE — Assessment & Plan Note (Signed)
Blood pressure is well controlled on today's visit. No changes made to the medications. 

## 2014-05-06 NOTE — Assessment & Plan Note (Signed)
Recommended that he drink in moderation

## 2014-05-06 NOTE — Assessment & Plan Note (Signed)
We have recommended that he stay on his current medications.  Stay on Lasix 20 ng twice a day Try to decrease his beer intake

## 2014-05-06 NOTE — Patient Instructions (Addendum)
You are doing well. No medication changes were made.  Please continue on lasix twice a day Take potassium as needed for cramping  Please call us if you have new issues that need to be addressed before your next appt.  Your physician wants you to follow-up in: 6 months.  You will receive a reminder letter in the mail two months in advance. If you don't receive a letter, please call our office to schedule the follow-up appointment.

## 2014-05-06 NOTE — Progress Notes (Signed)
Patient ID: Brad Stephens, male    DOB: 1965/09/12, 49 y.o.   MRN: [id number]  HPI Comments: 49 year old male with nonischemic cardiomyopathy possibly secondary to alcohol abuse, who continues to drink alcohol ,  presenting in December 2013 with shortness of breath, orthopnea, PND . Outpatient stress test showed no ischemia . Marland Kitchen He travels frequently for business trips. He had short run of atrial fibrillation in the hospital.  hospital admission 12/22/2013 He presents today for follow-up of his cardiomyopathy, arrhythmia  In follow-up, he denies any tachycardia concerning for arrhythmia. Continues to drink alcohol periodically, volume unclear. He continues on Lasix 20 mg twice a day. Denies having excessive leg edema or shortness of breath with exertion. Reports his sugars are slowly improving. Hemoglobin A1c has improved, still mildly elevated He is having problems with insomnia, wakes up in the middle of the night, does some of his work, takes naps during the daytime. He reports that he takes diltiazem 30 mg pill in the morning and in the evening. This had previously been provided to take as needed for breakthrough arrhythmia  EKG on today's visit shows normal sinus rhythm with rate 94 bpm, no significant ST or T-wave changes  Other past medical history previously presented to the  hospital with midsternal chest pain relief with narcotics, glucose was 350, in the emergency room was found to be in atrial flutter, converting to normal sinus rhythm with medical management. Echocardiogram confirmed ejection fraction 20-25% which was previously seen in 2013, hemoglobin A1c 10.6 It was felt he had pleuritic chest pain, possibly exacerbated by arrhythmia On today's visit he reports that he is doing okay. He is taking his discharge medications including Lipitor, carvedilol, Lasix, lisinopril, metformin 1000 mg twice a day  Lab work in the hospital showing total cholesterol 111, LDL 31, TSH 0.84,  negative cardiac enzymes CT angiogram of the chest showing no PE, mild bilateral pleural effusions  Allergies  Allergen Reactions  . Codeine   . Prednisone   . Shellfish Allergy     Outpatient Encounter Prescriptions as of 05/06/2014  Medication Sig  . Albiglutide 30 MG PEN Inject 30 mg into the skin once a week.  Marland Kitchen aspirin 81 MG tablet Take 1 tablet (81 mg total) by mouth daily.  Marland Kitchen atorvastatin (LIPITOR) 40 MG tablet Take 1 tablet (40 mg total) by mouth daily.  . carvedilol (COREG) 25 MG tablet Take 1 tablet (25 mg total) by mouth 2 (two) times daily.  Marland Kitchen diltiazem (CARDIZEM) 30 MG tablet Take 1 tablet (30 mg total) by mouth 4 (four) times daily as needed.  . furosemide (LASIX) 20 MG tablet Take 1 tablet (20 mg total) by mouth 2 (two) times daily as needed.  Marland Kitchen glucose blood (ONE TOUCH ULTRA TEST) test strip Use as instructed twice daily  . Insulin Glargine (TOUJEO SOLOSTAR) 300 UNIT/ML SOPN Inject 20 Units into the skin at bedtime.  . Insulin Pen Needle 31G X 8 MM MISC Use with Toujeo pens.  Marland Kitchen lisinopril (PRINIVIL,ZESTRIL) 5 MG tablet Take 1 tablet (5 mg total) by mouth daily.  . metFORMIN (GLUCOPHAGE) 1000 MG tablet Take 1 tablet (1,000 mg total) by mouth 2 (two) times daily with a meal.  . ONETOUCH DELICA LANCETS 33G MISC Use to test sugars twice daily  . potassium chloride (K-DUR) 10 MEQ tablet Take 1 tablet (10 mEq total) by mouth daily as needed.    Past Medical History  Diagnosis Date  . Hypertension   . Gout   .  Cardiomyopathy     a. 12/2011 Echo: EF <25%, glob HK, mildly dilated LA,.  . Paroxysmal atrial fibrillation     a. 12/2011 2 hr [facility];  b. CHADS2 = 3 (CHF, HTN, DM).  . Diabetes     a. 12/2011 A1c = 10.0.  Marland Kitchen. Hyperlipidemia   . Chronic systolic CHF (congestive heart failure)     History reviewed. No pertinent past surgical history.  Social History  reports that he has never smoked. His smokeless tobacco use includes Chew. He  reports that he drinks alcohol. He reports that he does not use illicit drugs.  Family History family history is negative for Diabetes.  Review of Systems  Constitutional: Negative.   HENT: Negative.   Respiratory: Negative.   Cardiovascular: Negative.   Gastrointestinal: Negative.   Musculoskeletal: Negative.   Neurological: Negative.   Hematological: Negative.   Psychiatric/Behavioral: Negative.   All other systems reviewed and are negative.   BP 120/80 mmHg  Pulse 94  Ht 6' (1.829 m)  Wt 260 lb 8 oz (118.162 kg)  BMI 35.32 kg/m2  Physical Exam  Constitutional: He is oriented to person, place, and time. He appears well-developed and well-nourished.  HENT:  Head: Normocephalic.  Nose: Nose normal.  Mouth/Throat: Oropharynx is clear and moist.  Eyes: Conjunctivae are normal. Pupils are equal, round, and reactive to light.  Neck: Normal range of motion. Neck supple. No JVD present.  Cardiovascular: Regular rhythm, S1 normal, S2 normal, normal heart sounds and intact distal pulses.  Tachycardia present.  Exam reveals no gallop and no friction rub.   No murmur heard. Pulmonary/Chest: Effort normal and breath sounds normal. No respiratory distress. He has no wheezes. He has no rales. He exhibits no tenderness.  Abdominal: Soft. Bowel sounds are normal. He exhibits no distension. There is no tenderness.  Musculoskeletal: Normal range of motion. He exhibits no edema or tenderness.  Lymphadenopathy:    He has no cervical adenopathy.  Neurological: He is alert and oriented to person, place, and time. Coordination normal.  Skin: Skin is warm and dry. No rash noted. No erythema.  Psychiatric: He has a normal mood and affect. His behavior is normal. Judgment and thought content normal.      Assessment and Plan   Nursing note and vitals reviewed.

## 2014-05-06 NOTE — Assessment & Plan Note (Signed)
He denies any arrhythmia. We'll continue him on his current medications Each of his medications discussed with him in detail on today's visit

## 2014-05-06 NOTE — Assessment & Plan Note (Signed)
Recommended he try Benadryl for sleep Also start a regular exercise program

## 2014-05-27 ENCOUNTER — Other Ambulatory Visit: Payer: Self-pay | Admitting: *Deleted

## 2014-05-27 NOTE — Telephone Encounter (Signed)
Fax from pharmacy, wanting 3 month supply of Tanzeum. Faxed back pt is not on Tanzeum currently.

## 2014-07-07 ENCOUNTER — Encounter: Payer: Self-pay | Admitting: Endocrinology

## 2014-07-07 ENCOUNTER — Ambulatory Visit (INDEPENDENT_AMBULATORY_CARE_PROVIDER_SITE_OTHER): Payer: BLUE CROSS/BLUE SHIELD | Admitting: Endocrinology

## 2014-07-07 VITALS — BP 108/70 | HR 105 | Resp 12 | Ht 72.0 in | Wt 267.6 lb

## 2014-07-07 DIAGNOSIS — I10 Essential (primary) hypertension: Secondary | ICD-10-CM | POA: Diagnosis not present

## 2014-07-07 DIAGNOSIS — E1169 Type 2 diabetes mellitus with other specified complication: Secondary | ICD-10-CM

## 2014-07-07 DIAGNOSIS — E785 Hyperlipidemia, unspecified: Secondary | ICD-10-CM | POA: Insufficient documentation

## 2014-07-07 MED ORDER — ALBIGLUTIDE 50 MG ~~LOC~~ PEN: 50.0000 mg | PEN_INJECTOR | SUBCUTANEOUS | Status: AC

## 2014-07-07 NOTE — Assessment & Plan Note (Signed)
On statin. Followed by cards.  

## 2014-07-07 NOTE — Patient Instructions (Signed)
Increase Tanzeum to 50 mg [place] weekly. Notify if dont tolerate due to nausea, vomiting or abdominal pain.  As you start to notice sugars going below 90, then decrease toujeo by 2 units every 3 days till most sugars are in goal range 90-140.  Labs next week.  [facility] follow [facility].

## 2014-07-07 NOTE — Assessment & Plan Note (Signed)
BP at target today. Urine MA normal April 2016.

## 2014-07-07 NOTE — Assessment & Plan Note (Signed)
Recent A1c elevated and improving. Sugars are close to target, Patient had had recent weight gain. Will update A1c next week at 90 day mark.  Discussed maximizing GLP-1 and he is agreeable to increase Tanzeum to 50 mg weekly. Report back if does not tolerate.  Continue current metformin and Toujeo. If sugars are starting to go low, given instructions on how to wean down Toujeo.  He has elected to [facility], since I am transferring out of state.

## 2014-07-07 NOTE — Progress Notes (Signed)
Reason for visit-  Brad Stephens is a 49 y.o.-year-old male, here for follow up management of Type 2 diabetes, uncontrolled, without complications. Associated hx CHF ( EF~30%), CMP, pAF. Last visit April 2016.    HPI- Patient has been diagnosed with diabetes in Dec 2015 during recent hospitalization. Had prior preDM per patient. Hasn't seen DM education during recent hospitalization- referral denied at last visits  he had not been on insulin before.   *insulin started March 2016 due to high sugars   Pt is currently on a regimen of: - Metformin 1000 mg po bid -Tanzeum 30mg  [place] weekly ( start Feb 2016)  -Toujeo 20 units qhs ( start March 2016)  *tolerating well.  *reports insomnia lately. Reads a book on his Ipad prior to sleep. Used to use Cpap mask in the past, now not using it. Had tried Ambien in the past with good success. Hasn't tried to move insulin to morning time as yet.  * has been having weight gain    Last hemoglobin A1c was: 10.7% at [facility] in Dec 2015 Lab Results  Component Value Date   HGBA1C 7.9* 04/13/2014   HGBA1C 10.7* 12/23/2013     Pt checks his sugars 0.5 a day . Uses One Touch mini glucometer. By meter download they are:    PREMEAL Breakfast Lunch Dinner Bedtime Overall  Glucose range:     108-280  Mean/median:        POST-MEAL PC Breakfast PC Lunch PC Dinner  Glucose range:     Mean/median:       Hypoglycemia-  No lows. Lowest sugar was n/a; he has hypoglycemia awareness at 70.   Dietary habits- eats three times daily. Tries to limit carbs, sweetened beverages, sodas, desserts. Juicing some, eats lots of vegetables. Loves fried foods but trying to avoid. Eating more meals at home.  Exercise- walks for exercise . Travels a lot.  Weight - back up since last time Wt Readings from Last 3 Encounters:  07/07/14 267 lb 9.6 oz (121.383 kg)  05/06/14 260 lb 8 oz (118.162 kg)  04/13/14 264 lb (119.75 kg)    Diabetes Complications-  Nephropathy- No   CKD, last BUN/creatinine- GFR >60 Lab Results  Component Value Date   BUN 20* 12/24/2013   CREATININE 0.80 12/24/2013   Lab Results  Component Value Date   MICRALBCREAT 20.0 04/13/2014     Retinopathy- No, Last DEE was in jan 2015- has follow up appt this month-today Neuropathy- no numbness and tingling in his feet. No known neuropathy.  Associated history - No CAD- has other cardiac history as above . No prior stroke. No hypothyroidism. his last TSH was  Lab Results  Component Value Date   TSH 0.84 12/23/2013    Hyperlipidemia-  his last set of lipids were- Currently on Lipitor 40 mg ( recent start Dec 2015) Tolerating well.  Has appt with cards this April 2016.  Lab Results  Component Value Date   CHOL 111 12/23/2013   HDL 55 12/23/2013   LDLCALC 31 12/23/2013   TRIG 124 12/23/2013    Blood Pressure/HTN- Patient's blood pressure is well controlled today on current regimen that includes ACE-I.    I have reviewed the patient's past medical history, family and social history, surgical history, medications and allergies.   Current Outpatient Prescriptions on File Prior to Visit  Medication Sig Dispense Refill  . aspirin 81 MG tablet Take 1 tablet (81 mg total) by mouth daily. 30 tablet   .  atorvastatin (LIPITOR) 40 MG tablet Take 1 tablet (40 mg total) by mouth daily. 90 tablet 3  . carvedilol (COREG) 25 MG tablet Take 1 tablet (25 mg total) by mouth 2 (two) times daily. 180 tablet 3  . diltiazem (CARDIZEM) 30 MG tablet Take 1 tablet (30 mg total) by mouth 4 (four) times daily as needed. 360 tablet 3  . furosemide (LASIX) 20 MG tablet Take 1 tablet (20 mg total) by mouth 2 (two) times daily as needed. 180 tablet 3  . glucose blood (ONE TOUCH ULTRA TEST) test strip Use as instructed twice daily 100 each 12  . Insulin Glargine (TOUJEO SOLOSTAR) 300 UNIT/ML SOPN Inject 20 Units into the skin at bedtime. 4.5 mL 3  . Insulin Pen Needle 31G X 8 MM MISC Use with Toujeo pens. 100  each 3  . lisinopril (PRINIVIL,ZESTRIL) 5 MG tablet Take 1 tablet (5 mg total) by mouth daily. 90 tablet 3  . metFORMIN (GLUCOPHAGE) 1000 MG tablet Take 1 tablet (1,000 mg total) by mouth 2 (two) times daily with a meal. 180 tablet 2  . ONETOUCH DELICA LANCETS 33G MISC Use to test sugars twice daily 100 each 11  . potassium chloride (K-DUR) 10 MEQ tablet Take 1 tablet (10 mEq total) by mouth daily as needed. 90 tablet 3   No current facility-administered medications on file prior to visit.   Allergies  Allergen Reactions  . Codeine   . Prednisone   . Shellfish Allergy      Review of Systems- [ x ]  Complains of    [  ]  denies [  ] Recent weight change [  ]  Fatigue [  ] polydipsia [  ] polyuria [  ]  nocturia [  ]  vision difficulty [  ] chest pain [  ] shortness of breath [  ] leg swelling [  ] cough [  ] nausea/vomiting [  ] diarrhea [  ] constipation [  ] abdominal pain [  ]  tingling/numbness in extremities [  ]  concern with feet ( wounds/sores)   PE: BP 108/70 mmHg  Pulse 105  Resp 12  Ht 6' (1.829 m)  Wt 267 lb 9.6 oz (121.383 kg)  BMI 36.29 kg/m2  SpO2 96% Wt Readings from Last 3 Encounters:  07/07/14 267 lb 9.6 oz (121.383 kg)  05/06/14 260 lb 8 oz (118.162 kg)  04/13/14 264 lb (119.75 kg)   Exam: deferred  ASSESSMENT AND PLAN: Problem List Items Addressed This Visit      Cardiovascular and Mediastinum   HTN (hypertension)    BP at target today. Urine MA normal April 2016.              Relevant Medications   Albiglutide (TANZEUM) 50 MG PEN   Other Relevant Orders   Comprehensive metabolic panel   Hemoglobin A1c   Ambulatory referral to Endocrinology     Endocrine   Diabetes mellitus, type II - Primary     Recent A1c elevated and improving. Sugars are close to target, Patient had had recent weight gain. Will update A1c next week at 90 day mark.  Discussed maximizing GLP-1 and he is agreeable to increase Tanzeum to 50 mg weekly. Report back if  does not tolerate.  Continue current metformin and Toujeo. If sugars are starting to go low, given instructions on how to wean down Toujeo.  He has elected to follow [facility], since I am [place] out of  [place].                   Relevant Medications   Albiglutide (TANZEUM) 50 MG PEN   Other Relevant Orders   Comprehensive metabolic panel   Hemoglobin A1c   Ambulatory referral to Endocrinology     Other   HLD (hyperlipidemia)    On statin. Followed by cards.         Last lipids were at target on current statin.   - Return to clinic in 3 months with sugar log/meter. Explained that I am transferring out of state, and he has elected to follow up with [facility] clinic. Referral placed.    Fancy Dunkley [facility] 07/07/2014 2:01 PM

## 2014-07-07 NOTE — Progress Notes (Signed)
Pre visit review using our clinic review tool, if applicable. No additional management support is needed unless otherwise documented below in the visit note. 

## 2014-07-13 ENCOUNTER — Ambulatory Visit: Payer: BLUE CROSS/BLUE SHIELD | Admitting: Endocrinology

## 2014-07-16 ENCOUNTER — Other Ambulatory Visit (INDEPENDENT_AMBULATORY_CARE_PROVIDER_SITE_OTHER): Payer: BLUE CROSS/BLUE SHIELD

## 2014-07-16 DIAGNOSIS — I10 Essential (primary) hypertension: Secondary | ICD-10-CM | POA: Diagnosis not present

## 2014-07-16 DIAGNOSIS — E1169 Type 2 diabetes mellitus with other specified complication: Secondary | ICD-10-CM | POA: Diagnosis not present

## 2014-07-16 LAB — COMPREHENSIVE METABOLIC PANEL
ALT: 84 U/L — ABNORMAL HIGH (ref 0–53)
AST: 50 U/L — AB (ref 0–37)
Albumin: 4.3 g/dL (ref 3.5–5.2)
Alkaline Phosphatase: 63 U/L (ref 39–117)
BILIRUBIN TOTAL: 0.5 mg/dL (ref 0.2–1.2)
BUN: 16 mg/dL (ref 6–23)
CO2: 32 mEq/L (ref 19–32)
Calcium: 9.8 mg/dL (ref 8.4–10.5)
Chloride: 100 mEq/L (ref 96–112)
Creatinine, Ser: 0.84 mg/dL (ref 0.40–1.50)
GFR: 103.39 mL/min (ref >60.00)
Glucose, Bld: 135 mg/dL — ABNORMAL HIGH (ref 70–99)
Potassium: 4.1 mEq/L (ref 3.5–5.1)
Sodium: 141 mEq/L (ref 135–145)
Total Protein: 7.1 g/dL (ref 6.0–8.3)

## 2014-07-16 LAB — HEMOGLOBIN A1C: HEMOGLOBIN A1C: 6.6 % — AB (ref 4.6–6.5)

## 2014-07-20 ENCOUNTER — Ambulatory Visit: Payer: BLUE CROSS/BLUE SHIELD | Admitting: Endocrinology

## 2014-09-11 ENCOUNTER — Other Ambulatory Visit: Payer: Self-pay | Admitting: Endocrinology

## 2014-09-13 ENCOUNTER — Other Ambulatory Visit: Payer: Self-pay | Admitting: Cardiovascular Disease

## 2014-11-30 ENCOUNTER — Telehealth: Payer: Self-pay | Admitting: Cardiovascular Disease

## 2014-11-30 NOTE — Telephone Encounter (Signed)
3 attempts to schedule fu from recall list.   LMOV to schedule with office.   Deleting recall.   °

## 2015-03-30 IMAGING — CR DG CHEST 2V
1 series · 2 of 2 positions shown · non-contrast
Comparison: 12/30/2011

CLINICAL DATA: Chest pain and shortness of breath

EXAM:
CHEST  2 VIEW

[Series 1: dxr chest pa (or ap) and lateral · 0.14mm/px · 2 of 2 slices shown]
[im 1/2]
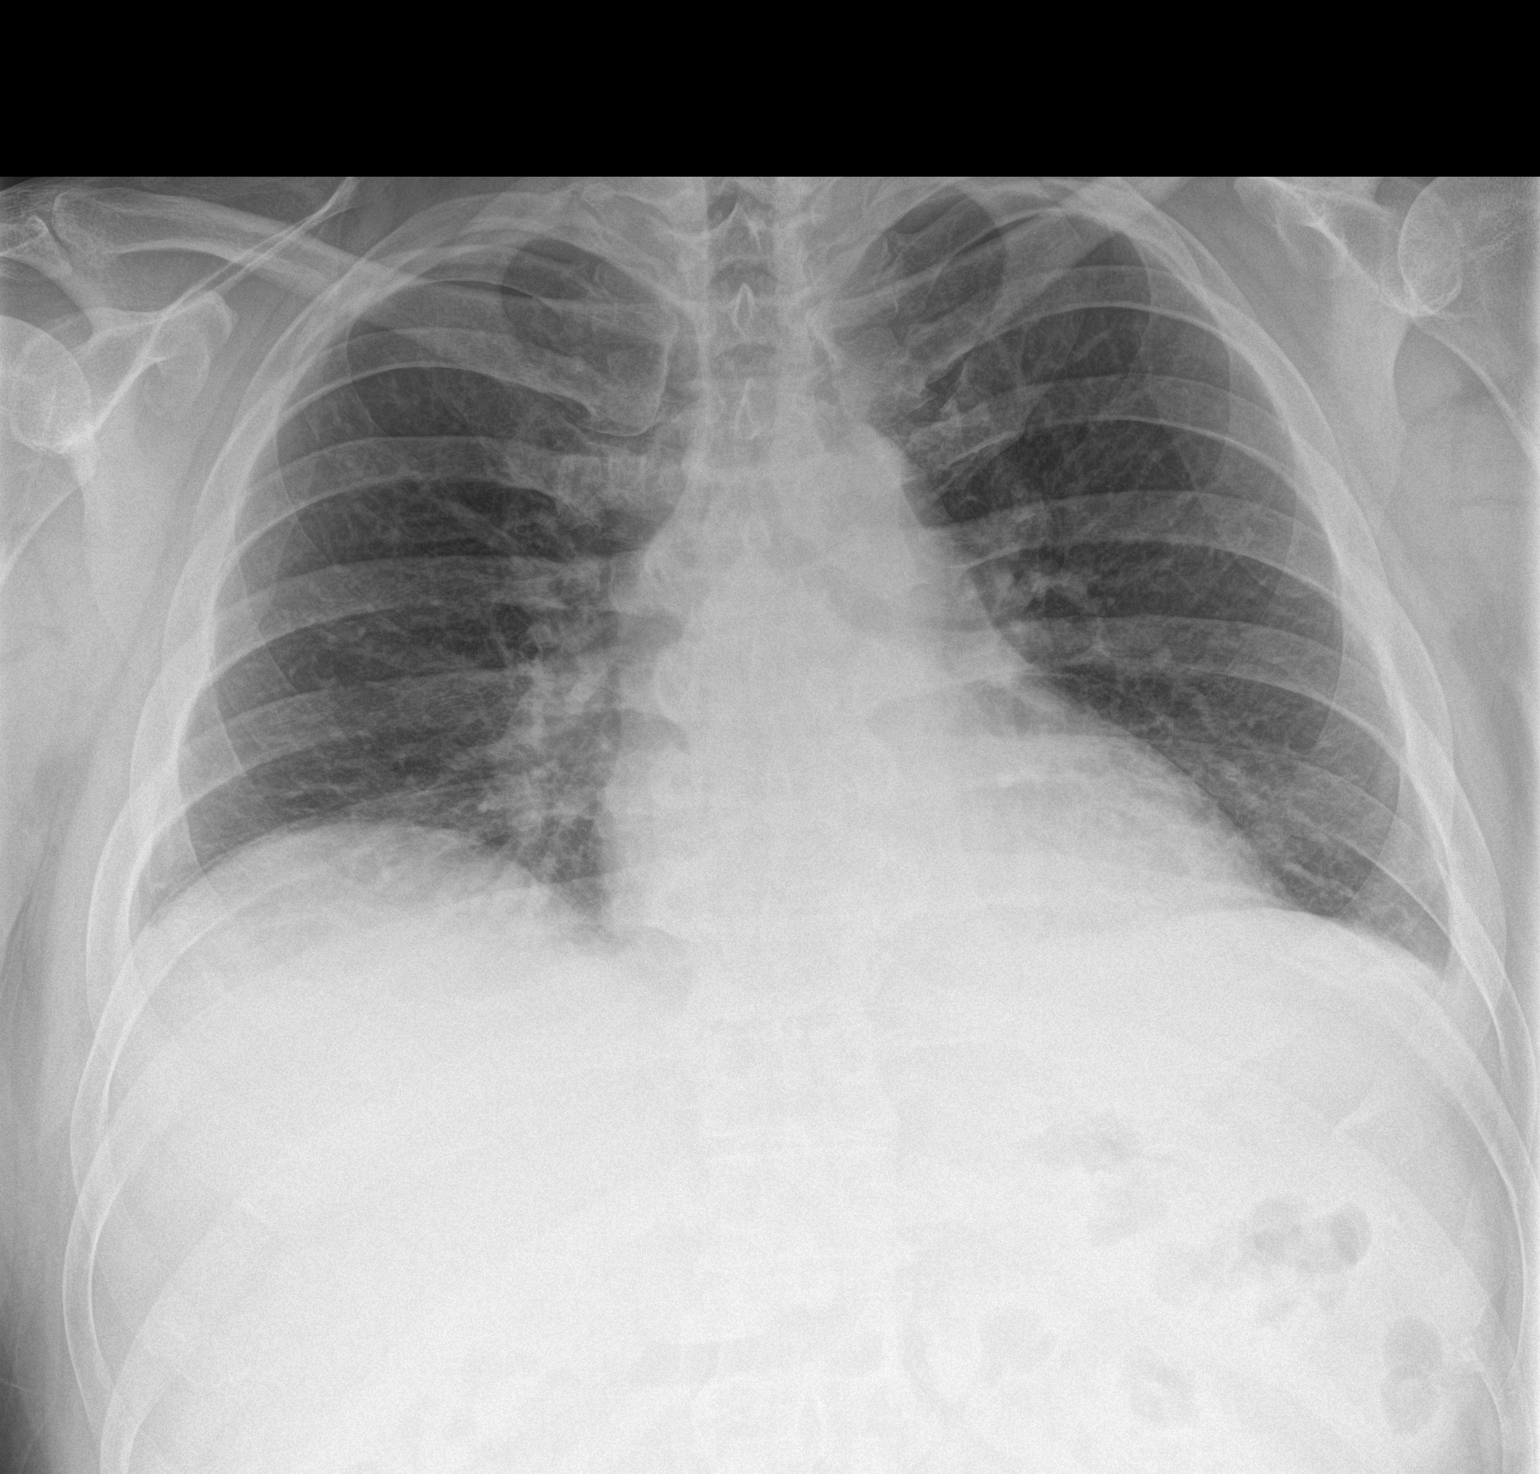
[im 2/2]
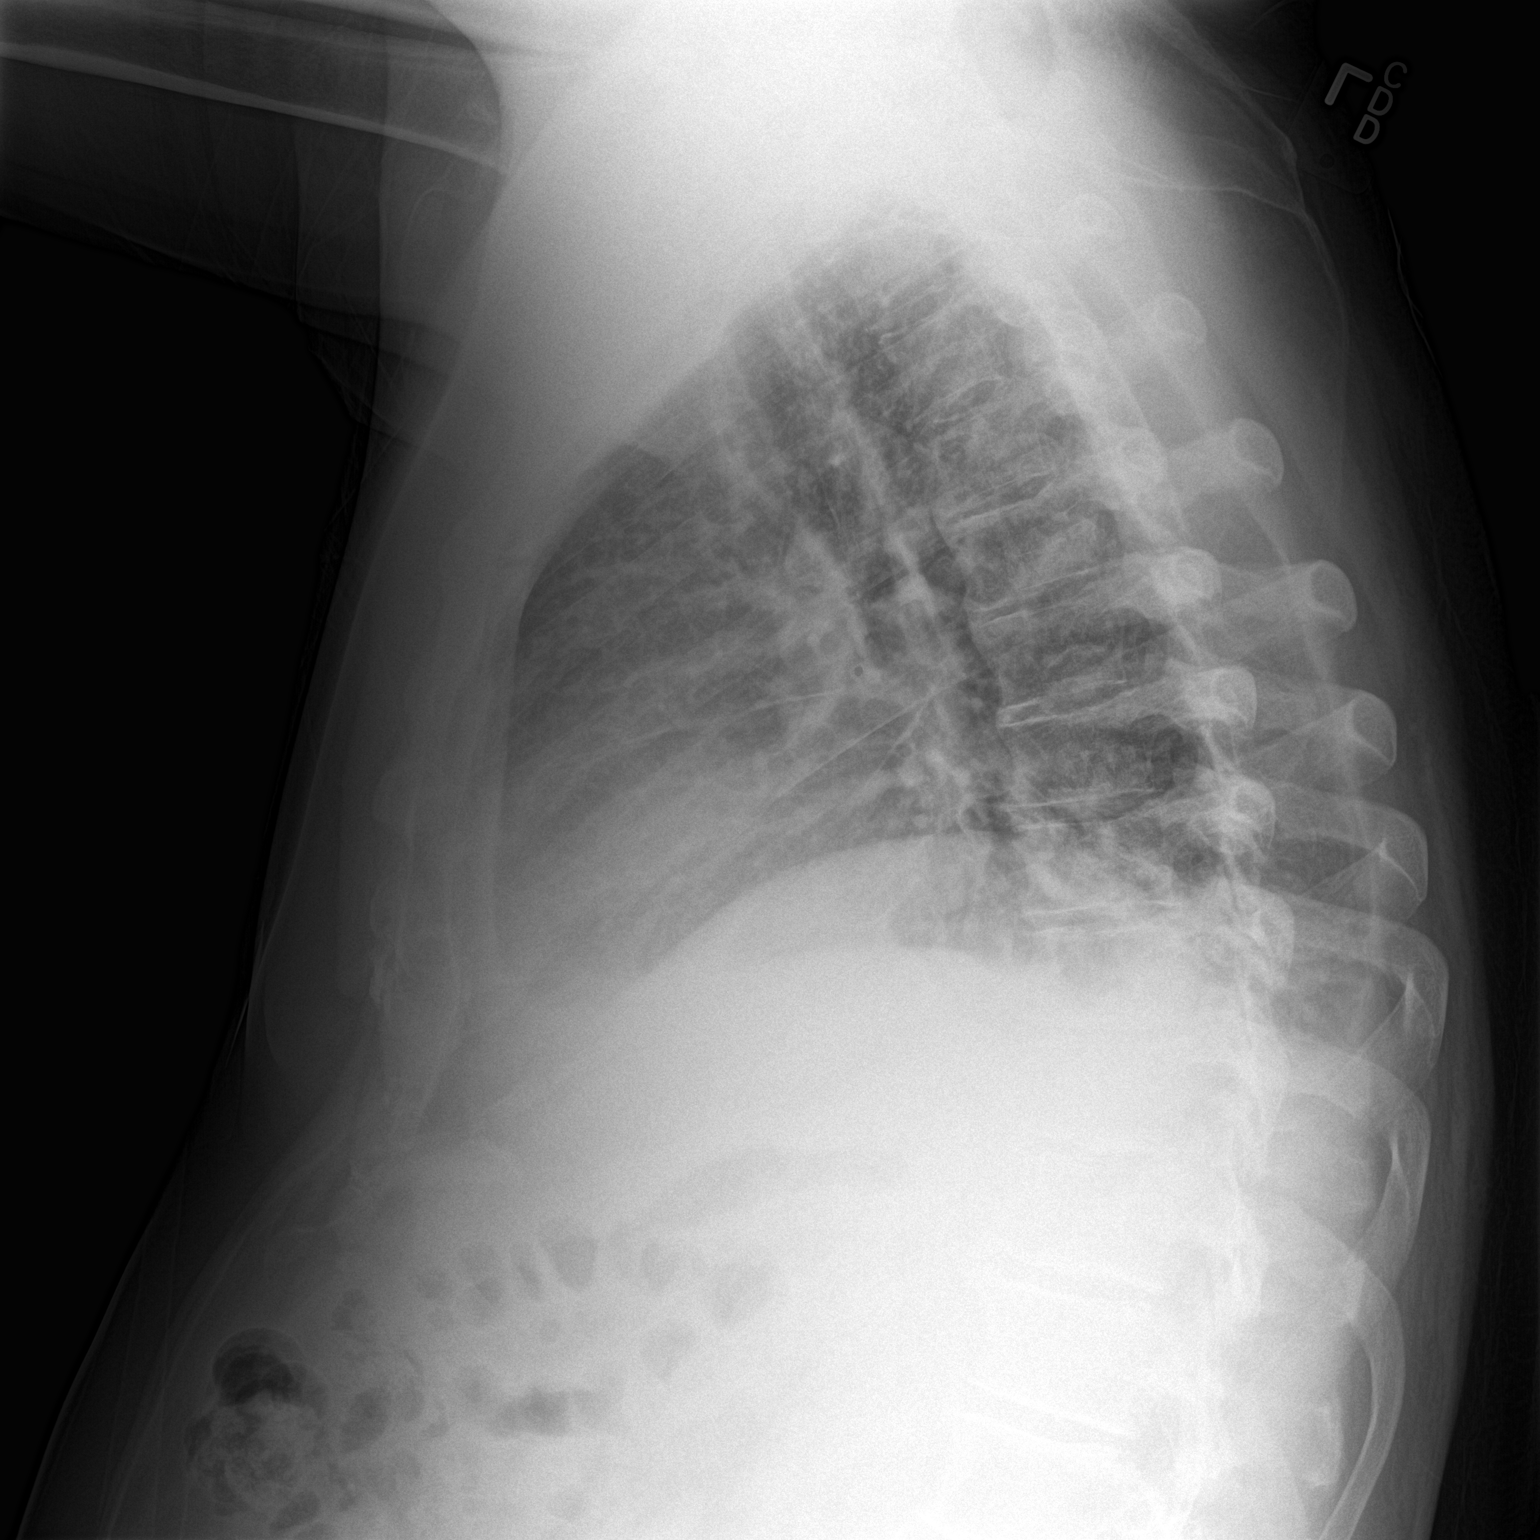

[2 of 2 positions shown; findings below may reference images not displayed]

FINDINGS: Interstitial coarsening at the bases, accentuated by low lung
volumes, without overt edema. Heart size is normal. Negative aortic
and hilar contours. Trace bilateral pleural effusions.
IMPRESSION: Small pleural effusions and bibasilar atelectasis.

## 2015-03-30 IMAGING — CT CT ANGIO CHEST
2 of 5 series · 18 of 36 positions shown · IV contrast (APPLIED)
Comparison: CT scan of December 30, 2011.

CLINICAL DATA: Acute chest pain.  Shortness of breath.

EXAM:
CT ANGIOGRAPHY CHEST WITH CONTRAST
TECHNIQUE: Multidetector CT imaging of the chest was performed using the
standard protocol during bolus administration of intravenous
contrast. Multiplanar CT image reconstructions and MIPs were
obtained to evaluate the vascular anatomy.
CONTRAST:  100 mL Omnipaque 350 intravenously.

[Series 5: pe 1.0 thins · axial · 0.85mm/px · z∈[-346,-56]mm · 17 of 328 slices shown]
[im 19/328  lung]
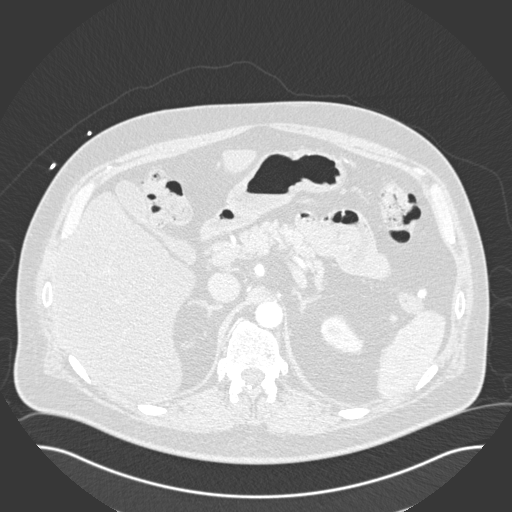
[im 37/328  mediastinal]
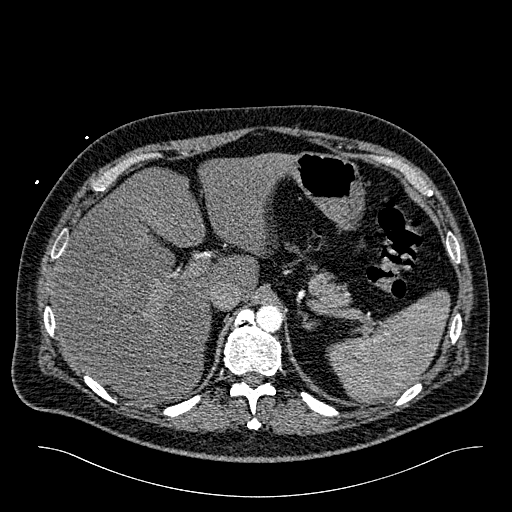
[im 55/328  lung]
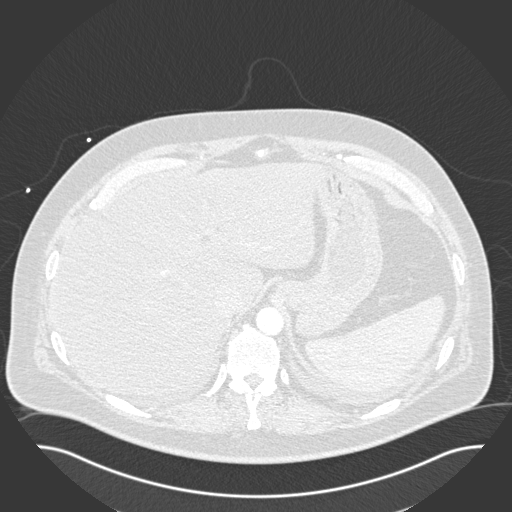
[im 73/328  mediastinal]
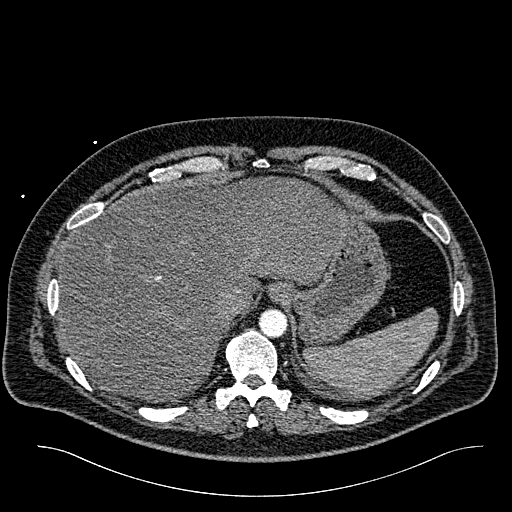
[im 91/328  lung]
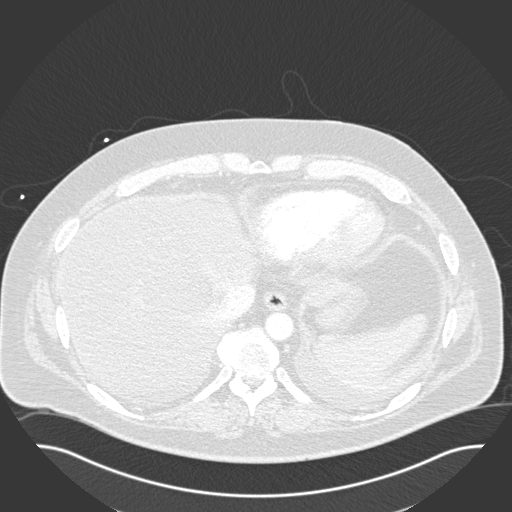
[im 110/328  mediastinal]
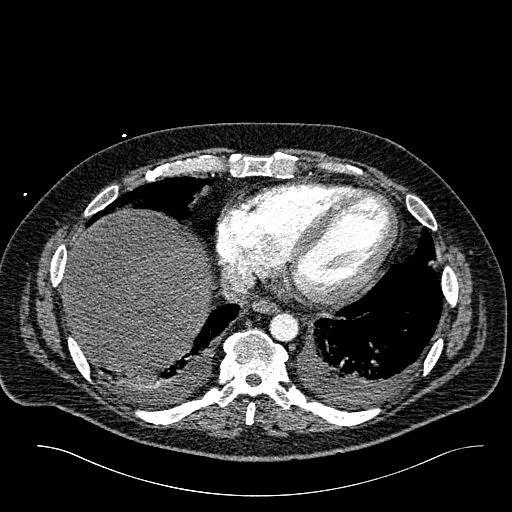
[im 128/328  lung]
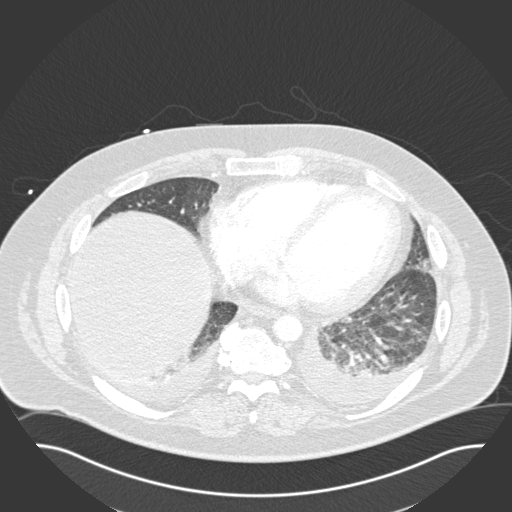
[im 146/328  mediastinal]
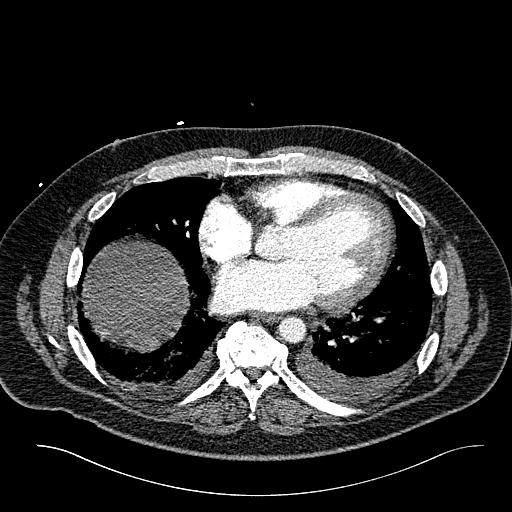
[im 164/328  lung]
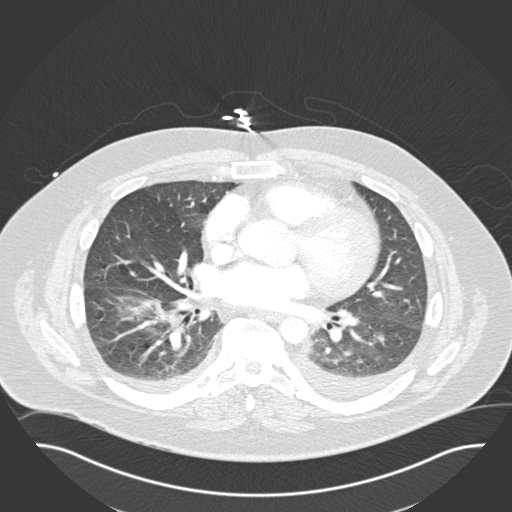
[im 182/328  mediastinal]
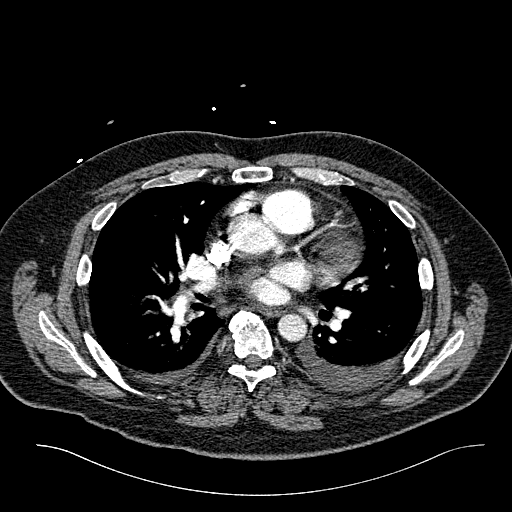
[im 200/328  lung]
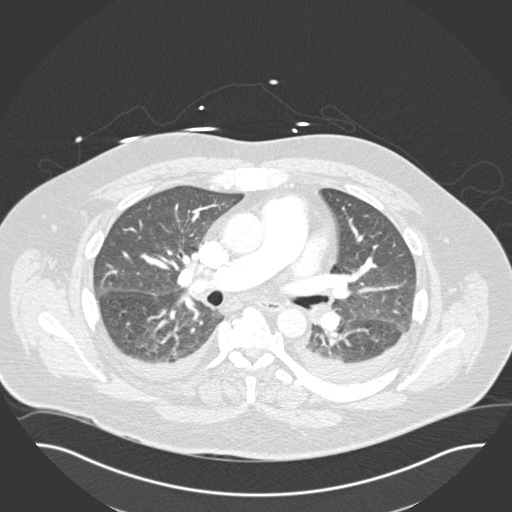
[im 219/328  mediastinal]
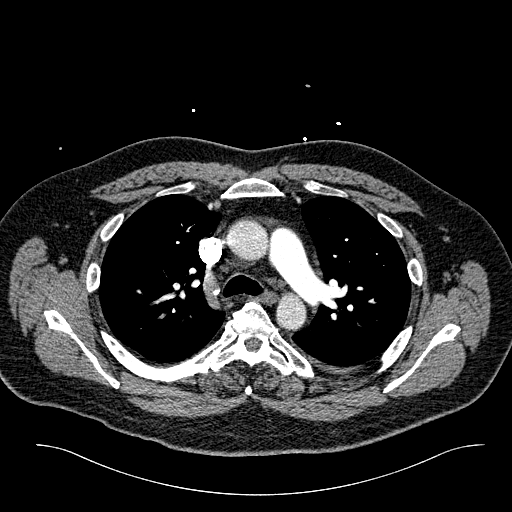
[im 237/328  lung]
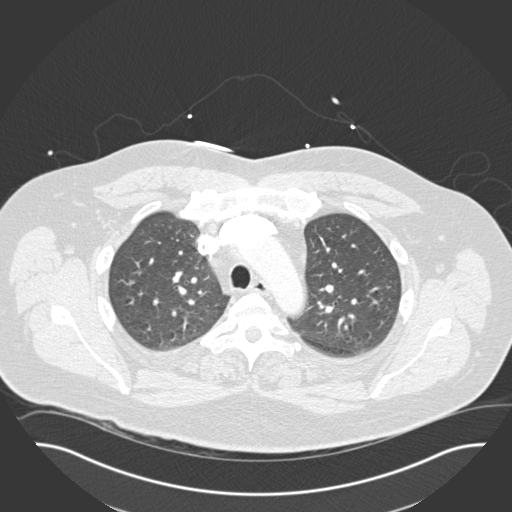
[im 255/328  mediastinal]
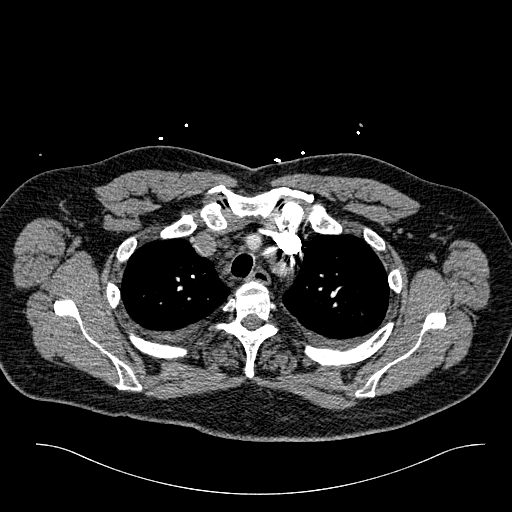
[im 273/328  lung]
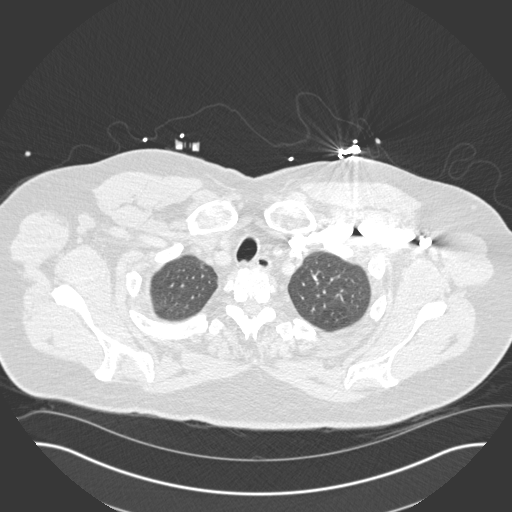
[im 291/328  mediastinal]
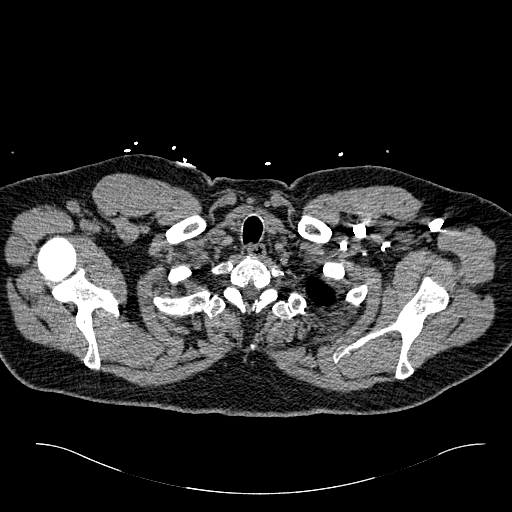
[im 309/328  lung]
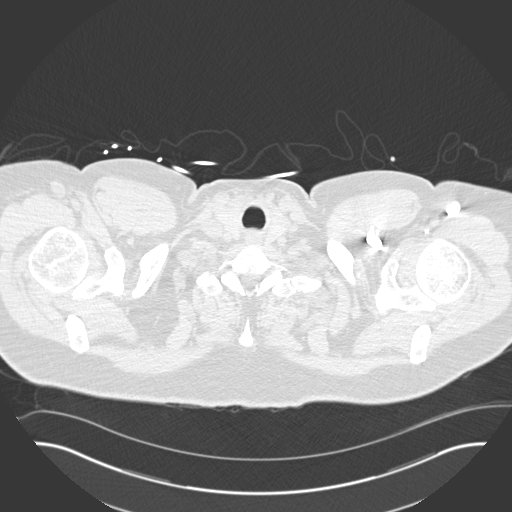

[Series 7: cor pe 2.0 mpr · coronal · 0.65mm/px · 1 of 135 slices shown]
[im 68/135  mediastinal]
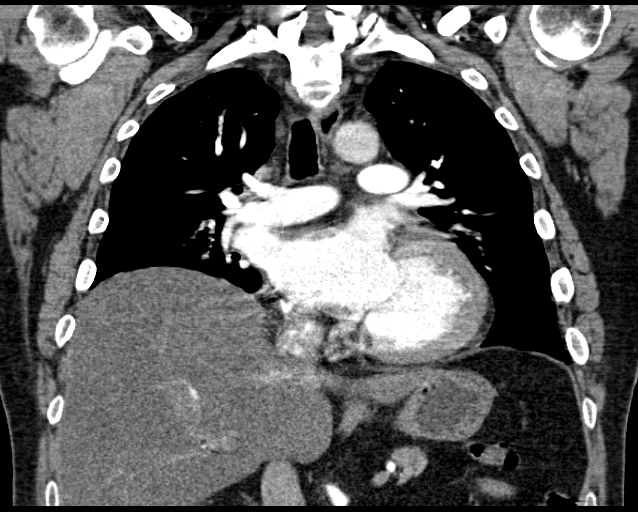

[18 of 36 positions shown; findings below may reference images not displayed]

FINDINGS: No pneumothorax is noted. Mild bilateral pleural effusions are noted
with adjacent subsegmental atelectasis or edema. 5 mm right apical
nodule is noted. There is no evidence of large central pulmonary
embolus. Unfortunately, lower lobe branches are not well visualized
due to respiratory motion artifact, and therefore smaller peripheral
emboli cannot be excluded on the basis of this exam. The thoracic
aorta appears normal. No mediastinal mass or adenopathy is noted.
Fatty infiltration of the liver is noted in the visualized portion
of the upper abdomen. No significant osseous abnormality is noted in
the chest.

Review of the MIP images confirms the above findings.
IMPRESSION: Fatty infiltration of the liver.

Mild bilateral pleural effusions are noted with adjacent
subsegmental atelectasis or edema.

No evidence of large central pulmonary embolus. However, due to
respiratory motion artifact, smaller peripheral pulmonary emboli in
the lower lobe branches bilaterally cannot be excluded on the basis
of this exam.

5 mm right apical nodule is noted. If the patient is at high risk
for bronchogenic carcinoma, follow-up chest CT at 6-12 months is
recommended. If the patient is at low risk for bronchogenic
carcinoma, follow-up chest CT at 12 months is recommended. This
recommendation follows the consensus statement: Guidelines for
Management of Small Pulmonary Nodules Detected on CT Scans: A
Statement from the [HOSPITAL] as published in Radiology

## 2015-07-29 ENCOUNTER — Other Ambulatory Visit: Payer: Self-pay | Admitting: *Deleted

## 2015-07-29 MED ORDER — DILTIAZEM HCL 30 MG PO TABS: 30.0000 mg | ORAL_TABLET | Freq: Four times a day (QID) | ORAL | Status: AC | PRN

## 2015-07-29 NOTE — Telephone Encounter (Signed)
Requested Prescriptions   Signed Prescriptions Disp Refills  . diltiazem (CARDIZEM) 30 MG tablet 120 tablet 0    Sig: Take 1 tablet (30 mg total) by mouth 4 (four) times daily as needed.    Authorizing Provider: Antonieta IbaGOLLAN, TIMOTHY J    Ordering User: Kendrick FriesLOPEZ, MARINA C

## 2015-11-13 ENCOUNTER — Other Ambulatory Visit: Payer: Self-pay | Admitting: Cardiovascular Disease
